# Patient Record
Sex: Female | Born: 1970
Health system: Southern US, Community
[De-identification: ages and names within clinical notes are randomized; demographics above are authoritative.]

## PROBLEM LIST (undated history)

## (undated) DIAGNOSIS — I10 Essential (primary) hypertension: Secondary | ICD-10-CM

---

## 2012-01-12 ENCOUNTER — Emergency Department (INDEPENDENT_AMBULATORY_CARE_PROVIDER_SITE_OTHER): Payer: PRIVATE HEALTH INSURANCE

## 2012-01-12 ENCOUNTER — Encounter (HOSPITAL_BASED_OUTPATIENT_CLINIC_OR_DEPARTMENT_OTHER): Payer: Self-pay | Admitting: *Deleted

## 2012-01-12 ENCOUNTER — Emergency Department (HOSPITAL_BASED_OUTPATIENT_CLINIC_OR_DEPARTMENT_OTHER)
Admission: EM | Admit: 2012-01-12 | Discharge: 2012-01-12 | Disposition: A | Discharge status: Home / Self Care | Payer: PRIVATE HEALTH INSURANCE | Attending: Emergency Medicine | Admitting: Emergency Medicine

## 2012-01-12 DIAGNOSIS — IMO0001 Reserved for inherently not codable concepts without codable children: Secondary | ICD-10-CM | POA: Insufficient documentation

## 2012-01-12 DIAGNOSIS — M79609 Pain in unspecified limb: Secondary | ICD-10-CM

## 2012-01-12 DIAGNOSIS — M19079 Primary osteoarthritis, unspecified ankle and foot: Secondary | ICD-10-CM

## 2012-01-12 DIAGNOSIS — M79673 Pain in unspecified foot: Secondary | ICD-10-CM

## 2012-01-12 NOTE — ED Notes (Signed)
Patient transported to X-ray 

## 2012-01-12 NOTE — ED Provider Notes (Signed)
History     CSN: [id number]  Arrival date & time 01/12/12  1052   First MD Initiated Contact with Patient 01/12/12 1221      Chief Complaint  Patient presents with  . Foot Pain    (Consider location/radiation/quality/duration/timing/severity/associated sxs/prior treatment) Patient is a 41 y.o. female presenting with lower extremity pain. The history is provided by the patient. No language interpreter was used.  Foot Pain This is a new problem. The current episode started today. The problem has been gradually worsening. Associated symptoms include myalgias. Pertinent negatives include no joint swelling. The symptoms are aggravated by nothing. She has tried nothing for the symptoms. The treatment provided moderate relief.  Pt complains of pain in left foot,  Pt reports pain is in the heel of her foot.    History reviewed. No pertinent past medical history.  History reviewed. No pertinent past surgical history.  History reviewed. No pertinent family history.  History  Substance Use Topics  . Smoking status: Never Smoker   . Smokeless tobacco: Not on file  . Alcohol Use: No    OB History    Grav Para Term Preterm Abortions TAB SAB Ect Mult Living                  Review of Systems  Musculoskeletal: Positive for myalgias. Negative for joint swelling.  All other systems reviewed and are negative.    Allergies  Review of patient's allergies indicates no known allergies.  Home Medications  No current outpatient prescriptions on file.  BP 157/100  Pulse 94  Temp(Src) 98.4 F (36.9 C) (Oral)  Resp 20  SpO2 99%  LMP 12/30/2011  Physical Exam  Nursing note and vitals reviewed. Constitutional: She appears well-developed and well-nourished.  HENT:  Head: Normocephalic.  Musculoskeletal: Normal range of motion. She exhibits tenderness.       Tender heel of left foot,  From,  Nv and ns intact   Neurological: She is alert.  Skin: Skin is warm.  Psychiatric: She has a  normal mood and affect.    ED Course  Procedures (including critical care time)  Labs Reviewed - No data to display No results found.   No diagnosis found.    MDM  No results found for this or any previous visit. Dg Foot Complete Left  01/12/2012  *RADIOLOGY REPORT*  Clinical Data: Left foot pain.  No injury.  LEFT FOOT - COMPLETE 3+ VIEW  Comparison: None.  Findings: Mild first MTP joint osteoarthritis.  There is no fracture.  Soft tissues appear within normal limits.  Flexion deformity of the second toe is present at the time of imaging. Phleboliths are noted in the distal leg.  Calcaneus appears normal on the oblique and lateral projections.  There may be some mild forefoot soft tissue swelling.  IMPRESSION: No acute osseous abnormality.  Mild first MTP joint osteoarthritis.  Original Report Authenticated By: Andreas Newport, M.D.        Pt placed in an ace wrap and a post op shoe.  Pt advised ibuprofen    Lonia Skinner [place], [place] 01/12/12 1434

## 2012-01-12 NOTE — Discharge Instructions (Signed)

## 2012-01-12 NOTE — ED Notes (Signed)
Pt states she woke up Sunday morning with left foot pain no known injury reports she has had some intermittent swelling

## 2012-01-14 NOTE — ED Provider Notes (Signed)
History/physical exam/procedure(s) were performed by non-physician practitioner and as supervising physician I was immediately available for consultation/collaboration. I have reviewed all notes and am in agreement with care and plan.   Airam Runions S Ymani Porcher, MD 01/14/12 1437 

## 2018-07-21 ENCOUNTER — Other Ambulatory Visit: Payer: Self-pay | Admitting: Obstetrics & Gynecology

## 2018-07-21 DIAGNOSIS — Z1231 Encounter for screening mammogram for malignant neoplasm of breast: Secondary | ICD-10-CM

## 2018-08-12 ENCOUNTER — Other Ambulatory Visit: Payer: Self-pay | Admitting: *Deleted

## 2018-08-12 ENCOUNTER — Other Ambulatory Visit: Payer: Self-pay

## 2018-08-12 ENCOUNTER — Inpatient Hospital Stay: Payer: Federal, State, Local not specified - PPO

## 2018-08-12 ENCOUNTER — Encounter: Payer: Self-pay | Admitting: Hematology and Oncology

## 2018-08-12 ENCOUNTER — Other Ambulatory Visit: Payer: Self-pay | Admitting: Hematology and Oncology

## 2018-08-12 ENCOUNTER — Encounter (INDEPENDENT_AMBULATORY_CARE_PROVIDER_SITE_OTHER): Payer: Self-pay

## 2018-08-12 ENCOUNTER — Inpatient Hospital Stay
Payer: Federal, State, Local not specified - PPO | Attending: Hematology and Oncology | Admitting: Hematology and Oncology

## 2018-08-12 ENCOUNTER — Ambulatory Visit
Admission: RE | Admit: 2018-08-12 | Discharge: 2018-08-12 | Disposition: A | Discharge status: Home / Self Care | Payer: Federal, State, Local not specified - PPO | Source: Ambulatory Visit | Attending: Hematology and Oncology | Admitting: Hematology and Oncology

## 2018-08-12 VITALS — BP 147/89 | HR 77 | Temp 98.4°F | Resp 12 | Ht 67.0 in | Wt 189.3 lb

## 2018-08-12 DIAGNOSIS — E538 Deficiency of other specified B group vitamins: Secondary | ICD-10-CM

## 2018-08-12 DIAGNOSIS — D509 Iron deficiency anemia, unspecified: Secondary | ICD-10-CM

## 2018-08-12 DIAGNOSIS — R0602 Shortness of breath: Secondary | ICD-10-CM | POA: Diagnosis not present

## 2018-08-12 DIAGNOSIS — N920 Excessive and frequent menstruation with regular cycle: Secondary | ICD-10-CM | POA: Diagnosis not present

## 2018-08-12 LAB — CBC WITH DIFFERENTIAL/PLATELET
ABS IMMATURE GRANULOCYTES: 0.01 10*3/uL (ref 0.00–0.07)
BASOS PCT: 1 %
Basophils Absolute: 0 10*3/uL (ref 0.0–0.1)
EOS PCT: 3 %
Eosinophils Absolute: 0.1 10*3/uL (ref 0.0–0.5)
HEMATOCRIT: 25.6 % — AB (ref 36.0–46.0)
HEMOGLOBIN: 6.9 g/dL — AB (ref 12.0–15.0)
IMMATURE GRANULOCYTES: 0 %
LYMPHS PCT: 41 %
Lymphs Abs: 1.2 10*3/uL (ref 0.7–4.0)
MCH: 17.4 pg — ABNORMAL LOW (ref 26.0–34.0)
MCHC: 27 g/dL — ABNORMAL LOW (ref 30.0–36.0)
MCV: 64.5 fL — AB (ref 80.0–100.0)
MONO ABS: 0.2 10*3/uL (ref 0.1–1.0)
MONOS PCT: 8 %
NEUTROS ABS: 1.3 10*3/uL — AB (ref 1.7–7.7)
Neutrophils Relative %: 47 %
Platelets: 377 10*3/uL (ref 150–400)
RBC: 3.97 MIL/uL (ref 3.87–5.11)
RDW: 20.2 % — AB (ref 11.5–15.5)
WBC: 2.9 10*3/uL — AB (ref 4.0–10.5)
nRBC: 0 % (ref 0.0–0.2)

## 2018-08-12 LAB — RETICULOCYTES
Immature Retic Fract: 15.3 % (ref 2.3–15.9)
RBC.: 3.97 MIL/uL (ref 3.87–5.11)
RETIC COUNT ABSOLUTE: 49.6 10*3/uL (ref 19.0–186.0)
RETIC CT PCT: 1.3 % (ref 0.4–3.1)

## 2018-08-12 LAB — SAMPLE TO BLOOD BANK

## 2018-08-12 LAB — PREPARE RBC (CROSSMATCH)

## 2018-08-12 LAB — FOLATE: FOLATE: 20.7 ng/mL (ref >5.9)

## 2018-08-12 LAB — PREGNANCY, URINE: Preg Test, Ur: NEGATIVE

## 2018-08-12 LAB — ABO/RH: ABO/RH(D): B POS

## 2018-08-12 MED ORDER — SODIUM CHLORIDE 0.9% FLUSH: 3.0000 mL | INTRAVENOUS | Status: DC | PRN

## 2018-08-12 MED ORDER — DIPHENHYDRAMINE HCL 25 MG PO CAPS
25.0000 mg | ORAL_CAPSULE | Freq: Once | ORAL | Status: AC
  Administered 2018-08-12: 25 mg via ORAL

## 2018-08-12 MED ORDER — SODIUM CHLORIDE 0.9% IV SOLUTION: 250.0000 mL | Freq: Once | INTRAVENOUS | Status: DC

## 2018-08-12 MED ORDER — SODIUM CHLORIDE 0.9% FLUSH: 10.0000 mL | INTRAVENOUS | Status: DC | PRN

## 2018-08-12 MED ORDER — ACETAMINOPHEN 325 MG PO TABS
650.0000 mg | ORAL_TABLET | Freq: Once | ORAL | Status: AC
  Administered 2018-08-12: 650 mg via ORAL

## 2018-08-12 NOTE — Progress Notes (Signed)
[facility]-  Cancer Center  Clinic day:  08/12/2018  Chief Complaint: Felicia Jackson is a 47 y.o. female with iron deficiency who is referred in consultation by Dr. Ranae Plumberhelsea Ward for assessment and management.  HPI:  Patient notes that she has known about her anemia for "several years". She was first diagnosed when she had to come into the ED secondary to menorrhagia. She did not require blood transfusion at that time. She was on antepartum oral iron supplementation, but only for a short time.    She was seen by Dr. Elesa MassedWard for her annual GYN exam on 07/21/2018.  Last period was 06/30/2018.  Menses described as moderate lastind 5-6 days.  She was noted to have fatigue.     CBC revealed a hematocrit of 29.6, hemoglobin 7.6, MCV 66.6, platelets 414,000, WBC 5100 with an ANC of 2420.  Differential was unremarkable.  CMP revealed a creatinine 0.7 and normal LFTs.  TSH was 2.232.  Ferritin was 2 with an iron saturation of < 2% and a TIBC of 492.5 (high).  B12 was 66.  Menstrual cycles are irregular. She notes that her cycles are not too heavy. Patient denies any other bleeding; no hematochezia, melena, or gross hematuria.   Symptomatically, patient has intermittent night sweats, however she attributes this to being pre-menopausal. Patient has periods of sore throat secondary to post-nasal drip. She denies chest pain, however she does experience episodes of exertional dyspnea while working. She denies vertigo symptoms.  She denies prior surgeries.  Patient advises that she maintains an adequate appetite. She is eating well. She eats meat on a regular basis. She only eats green leafy vegetables on special occassions. Weight today is 189 lb 4.8 oz (85.9 kg). She denies ice pica.   Patient denies pain in the clinic today.   History reviewed. No pertinent past medical history.  History reviewed. No pertinent surgical history.  Family History  Problem Relation Age of Onset  . Heart  attack Mother   . Hypertension Father     Social History:  reports that she has never smoked. She has never used smokeless tobacco. She reports that she drank alcohol. She reports that she does not use drugs.  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 Patient denies known exposures to radiation on toxins. The patient is accompanied by her friend, Felicia Jackson,  today.  Allergies: No Known Allergies  Current Medications: Current Outpatient Medications  Medication Sig Dispense Refill  . ibuprofen (ADVIL,MOTRIN) 100 MG/5ML suspension Take 200 mg by mouth every 6 (six) hours as needed for moderate pain.     No current facility-administered medications for this visit.     Review of Systems:  GENERAL:  Feels good.  Active.  No fevers or weight loss. Sweats at night felt to be peri-menopausal (non-drenching; "little moist"). PERFORMANCE STATUS (ECOG):  0 HEENT:  Sore throat comes and goes.  No visual changes, runny nose, mouth sores or tenderness. Lungs: Shortness of breath sometimes at work.  Cough.  No hemoptysis. Cardiac:  No chest pain, palpitations, orthopnea, or PND. GI:  No nausea, vomiting, diarrhea, constipation, melena or hematochezia. GU:  No urgency, frequency, dysuria, or hematuria. Musculoskeletal:  No back pain.  No joint pain.  No muscle tenderness. Extremities:  No pain or swelling. Skin:  No rashes or skin changes. Neuro:  No dizziness/lightheadedness.  No headache, numbness or weakness, balance or coordination issues. Endocrine:  No diabetes, thyroid issues, hot flashes.  Peri-menopasual sweats. Psych:  No mood changes, depression or anxiety. Pain:  No focal pain. Review of systems:  All other systems reviewed and found to be negative.  Physical Exam: Blood pressure (!) 147/89, pulse 77, temperature 98.4 F (36.9 C), temperature source Tympanic, resp. rate 12, height 5\' 7"  (1.702 m), weight 189 lb 4.8 oz (85.9 kg). GENERAL:  Well developed, well nourished, woman  sitting comfortably in the exam room in no acute distress. MENTAL STATUS:  Alert and oriented to person, place and time. HEAD:  Shoulder length black hair.  Normocephalic, atraumatic, face symmetric, no Cushingoid features. EYES:  Glasses.  Brown eyes.  Pupils equal round and reactive to light and accomodation.  No conjunctivitis or scleral icterus. ENT:  Oropharynx clear without lesion.  Tongue normal.  Poor dentition.  Mucous membranes moist.  RESPIRATORY:  Clear to auscultation without rales, wheezes or rhonchi. CARDIOVASCULAR:  Regular rate and rhythm without murmur, rub or gallop. ABDOMEN:  Soft, non-tender, with active bowel sounds, and no hepatosplenomegaly.  No masses. SKIN:  No rashes, ulcers or lesions. EXTREMITIES: No edema, no skin discoloration or tenderness.  No palpable cords. LYMPH NODES: No palpable cervical, supraclavicular, axillary or inguinal adenopathy  NEUROLOGICAL: Unremarkable. PSYCH:  Appropriate.   No visits with results within 3 Day(s) from this visit.  Latest known visit with results is:  No results found for any previous visit.    Assessment:  Felicia Jackson is a 47 y.o. female with iron deficiency and B12 deficiency.  He has heavy menses.  Diet is good.  Labs on 07/21/2018 revealed a hematocrit of 29.6, hemoglobin 7.6, MCV 66.6, platelets 414,000, WBC 5100 with an ANC of 2420.  Normal studies included: TSH, creatinine, and LFTs.  Ferritin was 2 with an iron saturation of < 2% and a TIBC of 492.5 (high).  B12 was 66.  Symptomatically, she notes shortness of breath at work.  She denies any hematuria, melena, or hematochezia.  Exam is unremarkable.  Plan: 1.  Labs today:  CBC with diff, retic, folate, hold tube, urine pregnancy test. 2.  Iron deficiency anemia:  Discuss trial of oral iron with OJ or vitamin C.  Discuss IV iron if unable to tolerate oral iron or hemoglobin and iron stores do not improve.  Ferritin goal is 100.  Preauth Venofer. 3.  B12  deficiency:  Discuss B12 deficiency.  Discuss oral B12 1000 mcg a day.  If unable to improve B12 with oral supplements, discuss B12 injections (weekly x 6 then monthly). 4.  RTC in 1 month for MD assessment, labs (CBC with diff, ferritin, B12 - day before), and +/- Venofer.    Quentin Mulling, NP  08/12/2018, 9:22 AM   I saw and evaluated the patient, participating in the key portions of the service and reviewing pertinent diagnostic studies and records.  I reviewed the nurse practitioner's note and agree with the findings and the plan.  The assessment and plan were discussed with the patient.  Several questions were asked by the patient and answered.   Nelva Nay, MD 08/12/2018,9:22 AM

## 2018-08-12 NOTE — Progress Notes (Signed)
Patient here for initial visit.she reports sudden occasional shortness of breath.

## 2018-08-13 LAB — TYPE AND SCREEN
ABO/RH(D): B POS
Antibody Screen: NEGATIVE
Unit division: 0

## 2018-08-13 LAB — BPAM RBC
Blood Product Expiration Date: 201912282359
ISSUE DATE / TIME: 201912061203
Unit Type and Rh: 7300

## 2018-09-05 ENCOUNTER — Telehealth: Payer: Self-pay | Admitting: *Deleted

## 2018-09-05 NOTE — Telephone Encounter (Signed)
Called patient to inquire how she is doing.  She states she is doing well.  She is taking her oral iron and B-12.  Patient states she is doing well.

## 2018-09-05 NOTE — Telephone Encounter (Signed)
-----   Message from Rosey BathMelissa C Corcoran, MD sent at 09/03/2018  5:30 AM EST -----  Please call patient and see how she is doing.  Is she taking her oral iron and tolerating it well?  Is she taking her B12?  M

## 2018-09-12 ENCOUNTER — Inpatient Hospital Stay: Payer: Federal, State, Local not specified - PPO | Attending: Hematology and Oncology

## 2018-09-12 DIAGNOSIS — D509 Iron deficiency anemia, unspecified: Secondary | ICD-10-CM | POA: Diagnosis not present

## 2018-09-12 DIAGNOSIS — E538 Deficiency of other specified B group vitamins: Secondary | ICD-10-CM | POA: Insufficient documentation

## 2018-09-12 LAB — CBC WITH DIFFERENTIAL/PLATELET
Abs Immature Granulocytes: 0.01 10*3/uL (ref 0.00–0.07)
Basophils Absolute: 0 10*3/uL (ref 0.0–0.1)
Basophils Relative: 1 %
Eosinophils Absolute: 0.1 10*3/uL (ref 0.0–0.5)
Eosinophils Relative: 1 %
HCT: 35.7 % — ABNORMAL LOW (ref 36.0–46.0)
Hemoglobin: 10 g/dL — ABNORMAL LOW (ref 12.0–15.0)
Immature Granulocytes: 0 %
Lymphocytes Relative: 36 %
Lymphs Abs: 1.8 10*3/uL (ref 0.7–4.0)
MCH: 20.6 pg — ABNORMAL LOW (ref 26.0–34.0)
MCHC: 28 g/dL — ABNORMAL LOW (ref 30.0–36.0)
MCV: 73.5 fL — ABNORMAL LOW (ref 80.0–100.0)
Monocytes Absolute: 0.4 10*3/uL (ref 0.1–1.0)
Monocytes Relative: 8 %
Neutro Abs: 2.8 10*3/uL (ref 1.7–7.7)
Neutrophils Relative %: 54 %
Platelets: 312 10*3/uL (ref 150–400)
RBC: 4.86 MIL/uL (ref 3.87–5.11)
RDW: 27.9 % — ABNORMAL HIGH (ref 11.5–15.5)
WBC: 5.1 10*3/uL (ref 4.0–10.5)
nRBC: 0 % (ref 0.0–0.2)

## 2018-09-12 LAB — VITAMIN B12: Vitamin B-12: 172 pg/mL — ABNORMAL LOW (ref 180–914)

## 2018-09-12 LAB — FERRITIN: Ferritin: 9 ng/mL — ABNORMAL LOW (ref 11–307)

## 2018-09-13 ENCOUNTER — Inpatient Hospital Stay: Payer: Federal, State, Local not specified - PPO

## 2018-09-13 ENCOUNTER — Inpatient Hospital Stay (HOSPITAL_BASED_OUTPATIENT_CLINIC_OR_DEPARTMENT_OTHER): Payer: Federal, State, Local not specified - PPO | Admitting: Hematology and Oncology

## 2018-09-13 ENCOUNTER — Other Ambulatory Visit: Payer: Self-pay | Admitting: Hematology and Oncology

## 2018-09-13 ENCOUNTER — Encounter: Payer: Self-pay | Admitting: Hematology and Oncology

## 2018-09-13 VITALS — BP 148/94 | HR 75 | Temp 96.9°F | Wt 191.7 lb

## 2018-09-13 DIAGNOSIS — E538 Deficiency of other specified B group vitamins: Secondary | ICD-10-CM

## 2018-09-13 DIAGNOSIS — D509 Iron deficiency anemia, unspecified: Secondary | ICD-10-CM | POA: Diagnosis not present

## 2018-09-13 MED ORDER — CYANOCOBALAMIN 1000 MCG/ML IJ SOLN
1000.0000 ug | Freq: Once | INTRAMUSCULAR | Status: AC
  Administered 2018-09-13: 1000 ug via INTRAMUSCULAR
  Filled 2018-09-13: qty 1

## 2018-09-13 MED ORDER — CYANOCOBALAMIN 1000 MCG/ML IJ SOLN: 1000.0000 ug | Freq: Once | INTRAMUSCULAR | Status: AC

## 2018-09-13 NOTE — Progress Notes (Signed)
[facility]-  Cancer Center  Clinic day:  09/13/2018  Chief Complaint: Felicia Jackson is a 48 y.o. female with iron deficiency and B12 deficiency who is seen for 1 month assessment.  HPI:  The patient was last seen in the hematology clinic on 08/12/2018 for initial consultation.  She had iron deficiency and B12 deficiency.  She noted shortness of breath at work.  She denied any hematuria, melena, or hematochezia.  Exam was unremarkable.  Work-up revealed a hematocrit of 25.6, hemoglobin 6.9, MCV 64.5, platelets 377,000, WBC 2900 with an ANC of 1300.  Retic was 1.3%.  Folate was 20.7.  She received 1 unit of PRBCs on 08/12/2018.  We discussed a trial of oral iron and B12.    Labs on 09/12/2017 revealed a hematocrit of 35.7, hemoglobin 10.0, MCV 73.5, platelets 312,000, WBC 5100 With an ANC of 2800.  Ferritin was 9.  B12 was 172.  During the interim, she has noted no difference after the blood transfusion.  However, she is feeling good.  She denies any shortness of breath at work.  She denies any chest pain or palpitations.  She denies any pica.   She denies regular menstrual cycles.  Menses is every few months.  She is eating better.  She is eating 3 meals a day and better foods.  She is taking oral iron 1 a day without constipation.  She is taking B12 1 tablet a day.     History reviewed. No pertinent past medical history.  History reviewed. No pertinent surgical history.  Family History  Problem Relation Age of Onset  . Heart attack Mother   . Hypertension Father     Social History:  reports that she has never smoked. She has never used smokeless tobacco. She reports previous alcohol use. She reports that she does not use drugs.  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 Patient denies known exposures to radiation on toxins. The patient is alone  today.  Allergies: No Known Allergies  Current Medications: Current Outpatient Medications  Medication Sig  Dispense Refill  . Cyanocobalamin (B-12) 100 MCG TABS Take 1 tablet by mouth daily.    . ferrous sulfate 325 (65 FE) MG tablet Take 325 mg by mouth 2 (two) times daily with a meal.     . ibuprofen (ADVIL,MOTRIN) 100 MG/5ML suspension Take 200 mg by mouth every 6 (six) hours as needed for moderate pain.     No current facility-administered medications for this visit.     Review of Systems:  GENERAL:  Feels good.  No fevers, sweats or weight loss.  Weight up 2 pounds. PERFORMANCE STATUS (ECOG):  0 HEENT:  No visual changes, runny nose, sore throat, mouth sores or tenderness. Lungs: No shortness of breath or cough.  No hemoptysis. Cardiac:  No chest pain, palpitations, orthopnea, or PND. GI:  Eating better.  No nausea, vomiting, diarrhea, constipation, melena or hematochezia.  No pica. GU:  No urgency, frequency, dysuria, or hematuria. Musculoskeletal:  No back pain.  No joint pain.  No muscle tenderness. Extremities:  No pain or swelling. Skin:  No rashes or skin changes. Neuro:  No headache, numbness or weakness, balance or coordination issues. Endocrine:  No diabetes, thyroid issues, hot flashes or night sweats. Psych:  No mood changes, depression or anxiety. Pain:  No focal pain. Review of systems:  All other systems reviewed and found to be negative.   Physical Exam: Blood pressure (!) 148/94, pulse 75,  temperature (!) 96.9 F (36.1 C), temperature source Tympanic, weight 191 lb 11.2 oz (87 kg). GENERAL:  Well developed, well nourished, woman sitting comfortably in the exam room in no acute distress. MENTAL STATUS:  Alert and oriented to person, place and time. HEAD:  Shoulder length dark hair.  Normocephalic, atraumatic, face symmetric, no Cushingoid features. EYES:  Glasses.  Brown eyes.  Pupils equal round and reactive to light and accomodation.  No conjunctivitis or scleral icterus. ENT:  Oropharynx clear without lesion.  Tongue normal.  Poor dentition.  Mucous membranes moist.   RESPIRATORY:  Clear to auscultation without rales, wheezes or rhonchi. CARDIOVASCULAR:  Regular rate and rhythm without murmur, rub or gallop. ABDOMEN:  Soft, non-tender, with active bowel sounds, and no hepatosplenomegaly.  No masses. SKIN:  No rashes, ulcers or lesions. EXTREMITIES: No edema, no skin discoloration or tenderness.  No palpable cords. LYMPH NODES: No palpable cervical, supraclavicular, axillary or inguinal adenopathy  NEUROLOGICAL: Unremarkable. PSYCH:  Appropriate.    Appointment on 09/12/2018  Component Date Value Ref Range Status  . Ferritin 09/12/2018 9* 11 - 307 ng/mL Final   Performed at [facility], [address] [place], [place] [address]  . WBC 09/12/2018 5.1  4.0 - 10.5 K/uL Final  . RBC 09/12/2018 4.86  3.87 - 5.11 MIL/uL Final  . Hemoglobin 09/12/2018 10.0* 12.0 - 15.0 g/dL Final   Comment: Reticulocyte Hemoglobin testing may be clinically indicated, consider ordering this additional test [id number]   . HCT 09/12/2018 35.7* 36.0 - 46.0 % Final  . MCV 09/12/2018 73.5* 80.0 - 100.0 fL Final  . MCH 09/12/2018 20.6* 26.0 - 34.0 pg Final  . MCHC 09/12/2018 28.0* 30.0 - 36.0 g/dL Final  . RDW 56/21/308601/02/2019 27.9* 11.5 - 15.5 % Final  . Platelets 09/12/2018 312  150 - 400 K/uL Final  . nRBC 09/12/2018 0.0  0.0 - 0.2 % Final  . Neutrophils Relative % 09/12/2018 54  % Final  . Neutro Abs 09/12/2018 2.8  1.7 - 7.7 K/uL Final  . Lymphocytes Relative 09/12/2018 36  % Final  . Lymphs Abs 09/12/2018 1.8  0.7 - 4.0 K/uL Final  . Monocytes Relative 09/12/2018 8  % Final  . Monocytes Absolute 09/12/2018 0.4  0.1 - 1.0 K/uL Final  . Eosinophils Relative 09/12/2018 1  % Final  . Eosinophils Absolute 09/12/2018 0.1  0.0 - 0.5 K/uL Final  . Basophils Relative 09/12/2018 1  % Final  . Basophils Absolute 09/12/2018 0.0  0.0 - 0.1 K/uL Final  . Immature Granulocytes 09/12/2018 0  % Final  . Abs Immature Granulocytes 09/12/2018 0.01  0.00 - 0.07 K/uL Final    Performed at [facility], [address] [place], [place] [address]  . Vitamin B-12 09/12/2018 172* 180 - 914 pg/mL Final   Comment: (NOTE) This assay is not validated for testing neonatal or myeloproliferative syndrome specimens for Vitamin B12 levels. Performed at [facility], [address] [place], [place] [address]     Assessment:  Jasmine AweCarolyn Welge is a 48 y.o. female with iron deficiency and B12 deficiency.  He has heavy menses.  Diet is good.  Labs on 07/21/2018 revealed a hematocrit of 29.6, hemoglobin 7.6, MCV 66.6, platelets 414,000, WBC 5100 with an ANC of 2420.  Normal studies included: TSH, creatinine, and LFTs.  Ferritin was 2 with an iron saturation of < 2% and a TIBC of 492.5 (high).  B12 was 66.  Work-up on 08/12/2018 revealed a hematocrit of  25.6, hemoglobin 6.9, MCV 64.5, platelets 377,000, WBC 2900 with an ANC of 1300.  Retic was 1.3%.  Folate was 20.7.  She received 1 unit of PRBCs on 08/12/2018.  She is on oral iron.  Ferritin has been followed: 2 on 07/21/2018 and 9 on 09/12/2018.  She has B12 deficiency.  B12 was 66 on 07/21/2018 and 172 on 09/12/2018.  She is on oral B12.  Symptomatically, she is feeling better.  She denies any bleeding (melena, hematochezia, or hematuria).  Menstrual cycles are irregular.  Exam is unremarkable.  Hemoglobin is 10.  Plan: 1.   Review labs from yesterday. 2.   Iron deficiency anemia  Hematocrit is 35.7.  Hemoglobin is 10.  MCV is 73.5.  Patient tolerating oral iron well.  Ferritin is 9.  Ferritin goal is 100.  Continue oral iron as hemoglobin is improving. 3.   B12 deficiency  B12 is 172 (low).  Patient has not improved significantly after 1 month of oral B12.  Discuss B12 injections weekly x 6 then monthly.  Discuss r/o pernicious anemia (intrinsic factor antibodies and anti-parietal antibodies). 4.   B12 today and weekly x 5 5.   RTC in 5 weeks (for last B12 shot) for MD assessment, labs (CBC with diff,  ferritin, intrinsic factor, antiparietal antibodies), and B12.   Rosey BathMelissa C Corcoran, MD  09/13/2018, 5:35 PM

## 2018-09-20 ENCOUNTER — Inpatient Hospital Stay: Payer: Federal, State, Local not specified - PPO

## 2018-09-20 DIAGNOSIS — D509 Iron deficiency anemia, unspecified: Secondary | ICD-10-CM

## 2018-09-20 MED ORDER — CYANOCOBALAMIN 1000 MCG/ML IJ SOLN
1000.0000 ug | Freq: Once | INTRAMUSCULAR | Status: AC
  Administered 2018-09-20: 1000 ug via INTRAMUSCULAR

## 2018-09-27 ENCOUNTER — Inpatient Hospital Stay: Payer: Federal, State, Local not specified - PPO

## 2018-09-27 DIAGNOSIS — D509 Iron deficiency anemia, unspecified: Secondary | ICD-10-CM | POA: Diagnosis not present

## 2018-09-27 MED ORDER — CYANOCOBALAMIN 1000 MCG/ML IJ SOLN
1000.0000 ug | Freq: Once | INTRAMUSCULAR | Status: AC
  Administered 2018-09-27: 1000 ug via INTRAMUSCULAR

## 2018-10-04 ENCOUNTER — Inpatient Hospital Stay: Payer: Federal, State, Local not specified - PPO

## 2018-10-04 DIAGNOSIS — D509 Iron deficiency anemia, unspecified: Secondary | ICD-10-CM

## 2018-10-04 MED ORDER — CYANOCOBALAMIN 1000 MCG/ML IJ SOLN
1000.0000 ug | Freq: Once | INTRAMUSCULAR | Status: AC
  Administered 2018-10-04: 1000 ug via INTRAMUSCULAR

## 2018-10-11 ENCOUNTER — Inpatient Hospital Stay: Payer: Federal, State, Local not specified - PPO | Attending: Hematology and Oncology

## 2018-10-11 DIAGNOSIS — D509 Iron deficiency anemia, unspecified: Secondary | ICD-10-CM

## 2018-10-11 DIAGNOSIS — E538 Deficiency of other specified B group vitamins: Secondary | ICD-10-CM | POA: Diagnosis present

## 2018-10-11 MED ORDER — CYANOCOBALAMIN 1000 MCG/ML IJ SOLN
1000.0000 ug | Freq: Once | INTRAMUSCULAR | Status: AC
  Administered 2018-10-11: 1000 ug via INTRAMUSCULAR

## 2018-10-18 ENCOUNTER — Ambulatory Visit: Payer: Federal, State, Local not specified - PPO

## 2018-10-21 ENCOUNTER — Other Ambulatory Visit: Payer: Self-pay

## 2018-10-21 ENCOUNTER — Inpatient Hospital Stay (HOSPITAL_BASED_OUTPATIENT_CLINIC_OR_DEPARTMENT_OTHER): Payer: Federal, State, Local not specified - PPO | Admitting: Hematology and Oncology

## 2018-10-21 ENCOUNTER — Inpatient Hospital Stay: Payer: Federal, State, Local not specified - PPO

## 2018-10-21 ENCOUNTER — Encounter: Payer: Self-pay | Admitting: Hematology and Oncology

## 2018-10-21 VITALS — BP 131/87 | HR 73 | Temp 98.2°F | Resp 16 | Wt 195.3 lb

## 2018-10-21 DIAGNOSIS — D509 Iron deficiency anemia, unspecified: Secondary | ICD-10-CM

## 2018-10-21 DIAGNOSIS — E538 Deficiency of other specified B group vitamins: Secondary | ICD-10-CM | POA: Diagnosis not present

## 2018-10-21 LAB — CBC WITH DIFFERENTIAL/PLATELET
Abs Immature Granulocytes: 0.01 10*3/uL (ref 0.00–0.07)
Basophils Absolute: 0 10*3/uL (ref 0.0–0.1)
Basophils Relative: 1 %
Eosinophils Absolute: 0.1 10*3/uL (ref 0.0–0.5)
Eosinophils Relative: 2 %
HCT: 41.5 % (ref 36.0–46.0)
Hemoglobin: 12 g/dL (ref 12.0–15.0)
Immature Granulocytes: 0 %
Lymphocytes Relative: 33 %
Lymphs Abs: 1.4 10*3/uL (ref 0.7–4.0)
MCH: 23.7 pg — ABNORMAL LOW (ref 26.0–34.0)
MCHC: 28.9 g/dL — ABNORMAL LOW (ref 30.0–36.0)
MCV: 82 fL (ref 80.0–100.0)
Monocytes Absolute: 0.4 10*3/uL (ref 0.1–1.0)
Monocytes Relative: 9 %
Neutro Abs: 2.2 10*3/uL (ref 1.7–7.7)
Neutrophils Relative %: 55 %
Platelets: 305 10*3/uL (ref 150–400)
RBC: 5.06 MIL/uL (ref 3.87–5.11)
RDW: 22.7 % — ABNORMAL HIGH (ref 11.5–15.5)
WBC: 4 10*3/uL (ref 4.0–10.5)
nRBC: 0 % (ref 0.0–0.2)

## 2018-10-21 LAB — FERRITIN: Ferritin: 12 ng/mL (ref 11–307)

## 2018-10-21 MED ORDER — CYANOCOBALAMIN 1000 MCG/ML IJ SOLN
1000.0000 ug | Freq: Once | INTRAMUSCULAR | Status: AC
  Administered 2018-10-21: 1000 ug via INTRAMUSCULAR
  Filled 2018-10-21: qty 1

## 2018-10-21 NOTE — Progress Notes (Signed)
Pt here for follow up. Denies any concerns at this time.  

## 2018-10-21 NOTE — Progress Notes (Signed)
[facility]-  Cancer Center  Clinic day:  10/21/2018  Chief Complaint: Felicia Jackson is a 48 y.o. female with iron deficiency and B12 deficiency who is seen for 6 week assessment.  HPI:  The patient was last seen in the hematology clinic on 09/13/2018.  At that time, she was feeling better.  She denied any shortness of breath at work.  She was eating better.  She denied any pica.  She was tolerating oral iron well.  Labs revealed a hematocrit of 35.7, hemoglobin 10.0, and MCV 73.5.  Ferritin was 9.  B12 was 172.  She increased her oral iron from 1 day to BID.  She began weekly B12 x 6 (09/13/2018, 09/20/2018, 09/27/2018, 10/04/2018, 10/11/2018)  Symptomatically, patient is doing well. She notes that her energy continues to improve. She denies any associated shortness of breath. Patient is on oral iron supplements BID with a source of vitamin C. Patient denies any associated gastrointestinal side effects. Patient denies bleeding; no hematochezia, melena, or gross hematuria. Patient denies ice pica and restless legs.   Patient denies that she has experienced any B symptoms. She denies any interval infections.   Patient advises that she maintains an adequate appetite. She is eating well. Weight today is 195 lb 4.8 oz (88.6 kg), which compared to her last visit to the clinic, represents a 4 pound increase.  Patient denies pain in the clinic today.   History reviewed. No pertinent past medical history.  History reviewed. No pertinent surgical history.  Family History  Problem Relation Age of Onset  . Heart attack Mother   . Hypertension Father     Social History:  reports that she has never smoked. She has never used smokeless tobacco. She reports previous alcohol use. She reports that she does not use drugs.  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  Patient denies known exposures to radiation on toxins.  Allergies: No Known Allergies  Current  Medications: Current Outpatient Medications  Medication Sig Dispense Refill  . Cyanocobalamin (B-12) 100 MCG TABS Take 1 tablet by mouth daily.    . ferrous sulfate 325 (65 FE) MG tablet Take 325 mg by mouth 2 (two) times daily with a meal.     . ibuprofen (ADVIL,MOTRIN) 100 MG/5ML suspension Take 200 mg by mouth every 6 (six) hours as needed for moderate pain.     No current facility-administered medications for this visit.     Review of Systems:  GENERAL:  Feels good.  Energy level is better.  No fevers, sweats.  Weight up 4 pounds. PERFORMANCE STATUS (ECOG):  0 HEENT:  No visual changes, runny nose, sore throat, mouth sores or tenderness. Lungs: No shortness of breath or cough.  No hemoptysis. Cardiac:  No chest pain, palpitations, orthopnea, or PND. GI:  No nausea, vomiting, diarrhea, constipation, melena or hematochezia.  No ice pica. GU:  No urgency, frequency, dysuria, or hematuria. Musculoskeletal:  No back pain.  No joint pain.  No muscle tenderness. Extremities:  No pain or swelling. Skin:  No rashes or skin changes. Neuro:  No headache, numbness or weakness, balance or coordination issues.  No restless legs. Endocrine:  No diabetes, thyroid issues, hot flashes.  Peri-menopausal sweats. Psych:  No mood changes, depression or anxiety. Pain:  No focal pain. Review of systems:  All other systems reviewed and found to be negative.   Physical Exam: Blood pressure 131/87, pulse 73, temperature 98.2 F (36.8 C), temperature source Oral, resp.  rate 16, weight 195 lb 4.8 oz (88.6 kg), SpO2 99 %. GENERAL:  Well developed, well nourished, woman sitting comfortably in the exam room in no acute distress. MENTAL STATUS:  Alert and oriented to person, place and time. HEAD:  Shoulder length dark hair.  Normocephalic, atraumatic, face symmetric, no Cushingoid features. EYES:  Brown eyes.  Pupils equal round and reactive to light and accomodation.  No conjunctivitis or scleral icterus. ENT:   Oropharynx clear without lesion.  Tongue normal. Mucous membranes moist.  RESPIRATORY:  Clear to auscultation without rales, wheezes or rhonchi. CARDIOVASCULAR:  Regular rate and rhythm without murmur, rub or gallop. ABDOMEN:  Soft, non-tender, with active bowel sounds, and no hepatosplenomegaly.  No masses. SKIN:  No rashes, ulcers or lesions. EXTREMITIES: No edema, no skin discoloration or tenderness.  No palpable cords. LYMPH NODES: No palpable cervical, supraclavicular, axillary or inguinal adenopathy  NEUROLOGICAL: Unremarkable. PSYCH:  Appropriate.    Orders Only on 10/21/2018  Component Date Value Ref Range Status  . WBC 10/21/2018 4.0  4.0 - 10.5 K/uL Final  . RBC 10/21/2018 5.06  3.87 - 5.11 MIL/uL Final  . Hemoglobin 10/21/2018 12.0  12.0 - 15.0 g/dL Final  . HCT 48/54/6270 41.5  36.0 - 46.0 % Final  . MCV 10/21/2018 82.0  80.0 - 100.0 fL Final  . MCH 10/21/2018 23.7* 26.0 - 34.0 pg Final  . MCHC 10/21/2018 28.9* 30.0 - 36.0 g/dL Final  . RDW 35/00/9381 22.7* 11.5 - 15.5 % Final  . Platelets 10/21/2018 305  150 - 400 K/uL Final  . nRBC 10/21/2018 0.0  0.0 - 0.2 % Final   Performed at [facility], [address], [place], [place] [address]  . Neutrophils Relative % 10/21/2018 PENDING  % Incomplete  . Neutro Abs 10/21/2018 PENDING  1.7 - 7.7 K/uL Incomplete  . Band Neutrophils 10/21/2018 PENDING  % Incomplete  . Lymphocytes Relative 10/21/2018 PENDING  % Incomplete  . Lymphs Abs 10/21/2018 PENDING  0.7 - 4.0 K/uL Incomplete  . Monocytes Relative 10/21/2018 PENDING  % Incomplete  . Monocytes Absolute 10/21/2018 PENDING  0.1 - 1.0 K/uL Incomplete  . Eosinophils Relative 10/21/2018 PENDING  % Incomplete  . Eosinophils Absolute 10/21/2018 PENDING  0.0 - 0.5 K/uL Incomplete  . Basophils Relative 10/21/2018 PENDING  % Incomplete  . Basophils Absolute 10/21/2018 PENDING  0.0 - 0.1 K/uL Incomplete  . WBC Morphology 10/21/2018 PENDING   Incomplete  . RBC Morphology  10/21/2018 PENDING   Incomplete  . Smear Review 10/21/2018 PENDING   Incomplete  . Other 10/21/2018 PENDING  % Incomplete  . nRBC 10/21/2018 PENDING  0 /100 WBC Incomplete  . Metamyelocytes Relative 10/21/2018 PENDING  % Incomplete  . Myelocytes 10/21/2018 PENDING  % Incomplete  . Promyelocytes Relative 10/21/2018 PENDING  % Incomplete  . Blasts 10/21/2018 PENDING  % Incomplete    Assessment:  Ommie Casali is a 48 y.o. female with iron deficiency and B12 deficiency.  He has heavy menses.  Diet is good.  Labs on 07/21/2018 revealed a hematocrit of 29.6, hemoglobin 7.6, MCV 66.6, platelets 414,000, WBC 5100 with an ANC of 2420.  Normal studies included: TSH, creatinine, and LFTs.  Ferritin was 2 with an iron saturation of < 2% and a TIBC of 492.5 (high).  B12 was 66.  Work-up on 08/12/2018 revealed a hematocrit of 25.6, hemoglobin 6.9, MCV 64.5, platelets 377,000, WBC 2900 with an ANC of 1300.  Retic was 1.3%.  Folate was 20.7.  She received 1  unit of PRBCs on 08/12/2018.  She is on oral iron.  Ferritin has been followed: 2 on 07/21/2018 and 9 on 09/12/2018.  She has B12 deficiency.  B12 was 66 on 07/21/2018 and 172 on 09/12/2018.  She was on oral B12.  She began weekly B12 on 09/13/2018 (last 10/11/2018).  Symptomatically, she is doing well.  Energy level has improved.  Exam is unremarkable.  Plan: 1.   Labs today:  CBC with diff, ferritin, anti-parietal antibodies, anti-intrinsic factor antibodies. 2.   Iron deficiency anemia  Hemoglobin 12.0.  MCV 82.    Ferritin 12.  Ferritin goal is 100.  Continue oral iron BID. 3.   B12 deficiency  B12 level was 172 on 09/12/2018.  She began weekly B12 injections on 09/13/2018 (6 of 6 today).  Labs to r/o pernicious anemia today.  Begin B12 monthly beginning 11/18/2018. 4.   RTC in 3 months for MD assessment, labs (CBC with diff, ferritin - day before), B12 injection, and +/- Venofer.    Quentin Mulling, NP  10/21/2018, 2:31 PM   I saw and  evaluated the patient, participating in the key portions of the service and reviewing pertinent diagnostic studies and records.  I reviewed the nurse practitioner's note and agree with the findings and the plan.  The assessment and plan were discussed with the patient.  Several questions were asked by the patient and answered.   Nelva Nay, MD 10/21/2018,2:31 PM

## 2018-10-21 NOTE — Progress Notes (Signed)
B12 injection order is for Q4weeks but at last MD visit on 09/13/2018 check out note states for patient to have B12 injections that day and weekly x 5.  Today will be the 5th weekly B12 injection.

## 2018-10-23 LAB — ANTI-PARIETAL ANTIBODY: Parietal Cell Antibody-IgG: 34.8 Units — ABNORMAL HIGH (ref 0.0–20.0)

## 2018-10-24 LAB — INTRINSIC FACTOR ANTIBODIES: Intrinsic Factor: 1.1 AU/mL (ref 0.0–1.1)

## 2018-11-18 ENCOUNTER — Ambulatory Visit: Payer: Federal, State, Local not specified - PPO

## 2018-11-22 ENCOUNTER — Ambulatory Visit: Payer: Federal, State, Local not specified - PPO

## 2018-12-20 ENCOUNTER — Ambulatory Visit: Payer: Federal, State, Local not specified - PPO

## 2018-12-23 ENCOUNTER — Inpatient Hospital Stay: Payer: Federal, State, Local not specified - PPO | Attending: Hematology and Oncology

## 2019-01-18 ENCOUNTER — Other Ambulatory Visit: Payer: Federal, State, Local not specified - PPO

## 2019-01-19 ENCOUNTER — Ambulatory Visit: Payer: Federal, State, Local not specified - PPO

## 2019-01-19 ENCOUNTER — Ambulatory Visit: Payer: Federal, State, Local not specified - PPO | Admitting: Hematology and Oncology

## 2019-01-19 ENCOUNTER — Other Ambulatory Visit: Payer: Federal, State, Local not specified - PPO

## 2019-01-20 ENCOUNTER — Ambulatory Visit: Payer: Federal, State, Local not specified - PPO

## 2019-01-20 ENCOUNTER — Ambulatory Visit: Payer: Federal, State, Local not specified - PPO | Admitting: Hematology and Oncology

## 2019-02-17 ENCOUNTER — Telehealth: Payer: Self-pay

## 2019-02-17 NOTE — Telephone Encounter (Signed)
-----   Message from Lequita Asal, MD sent at 02/17/2019  2:28 PM EDT ----- Regarding: Please call patient  Patient has not had monthly B12 or f/u of her iron deficiency anemia.  M

## 2019-02-17 NOTE — Telephone Encounter (Signed)
Left a message to inform the patient that we haven't seen her recently in the office. And she need to call the office as soon as possible so we can get her reschedule for her B-12 injection and f/u on her Anemia. I have informed the patient to call [phone] so we can get her to Dr Mike Gip soon.

## 2019-10-25 DIAGNOSIS — I160 Hypertensive urgency: Secondary | ICD-10-CM | POA: Diagnosis not present

## 2019-10-25 DIAGNOSIS — R1310 Dysphagia, unspecified: Secondary | ICD-10-CM | POA: Diagnosis not present

## 2019-10-25 DIAGNOSIS — R490 Dysphonia: Secondary | ICD-10-CM | POA: Diagnosis not present

## 2019-12-27 ENCOUNTER — Emergency Department
Admission: EM | Admit: 2019-12-27 | Discharge: 2019-12-27 | Disposition: A | Discharge status: Home / Self Care | Payer: Federal, State, Local not specified - PPO | Attending: Emergency Medicine | Admitting: Emergency Medicine

## 2019-12-27 ENCOUNTER — Emergency Department: Payer: Federal, State, Local not specified - PPO

## 2019-12-27 ENCOUNTER — Other Ambulatory Visit: Payer: Self-pay

## 2019-12-27 DIAGNOSIS — M7552 Bursitis of left shoulder: Secondary | ICD-10-CM | POA: Diagnosis not present

## 2019-12-27 DIAGNOSIS — R030 Elevated blood-pressure reading, without diagnosis of hypertension: Secondary | ICD-10-CM | POA: Diagnosis not present

## 2019-12-27 DIAGNOSIS — I10 Essential (primary) hypertension: Secondary | ICD-10-CM | POA: Diagnosis not present

## 2019-12-27 DIAGNOSIS — M25512 Pain in left shoulder: Secondary | ICD-10-CM | POA: Diagnosis not present

## 2019-12-27 DIAGNOSIS — Z79899 Other long term (current) drug therapy: Secondary | ICD-10-CM | POA: Insufficient documentation

## 2019-12-27 HISTORY — DX: Essential (primary) hypertension: I10

## 2019-12-27 MED ORDER — HYDROCHLOROTHIAZIDE 25 MG PO TABS: 25.0000 mg | ORAL_TABLET | Freq: Every day | ORAL | 1 refills | Status: DC

## 2019-12-27 MED ORDER — PREDNISONE 10 MG PO TABS: ORAL_TABLET | ORAL | 0 refills | Status: DC

## 2019-12-27 NOTE — ED Notes (Signed)
Says left hsoulder pain for days.  Says it hurts upper back, left side neck.  She is unable to raise her arm to the side.  Good circulation and sensation.  Says her left thumb hurts to bend as well.

## 2019-12-27 NOTE — ED Provider Notes (Signed)
[facility] Emergency Department Provider Note  ____________________________________________   None    (approximate)  I have reviewed the triage vital signs and the nursing notes.   HISTORY  Chief Complaint Shoulder Pain   HPI Felicia Jackson is a 49 y.o. female presents to the ED with complaint of left shoulder pain for 6 days.  Patient states that it hurts in her shoulder, left upper back and left side of her neck.  Pain is increased with range of motion and she is also noticed that she is unable to raise her arm up secondary to pain.  She denies any injury to her shoulder both recently and in the past.  Also her initial blood pressure in triage was 215/104.  Patient states that she does not have a history of hypertension but states that she has been told before that it was elevated.  She denies any difficulty with headaches, chest pain, shortness of breath, dizziness or vision changes.  She does not have a PCP.  Currently she rates her pain as 6/10.       Past Medical History:  Diagnosis Date  . Hypertension     Patient Active Problem List   Diagnosis Date Noted  . Iron deficiency anemia 08/12/2018  . B12 deficiency 08/12/2018    History reviewed. No pertinent surgical history.  Prior to Admission medications   Medication Sig Start Date End Date Taking? Authorizing Provider  Cyanocobalamin (B-12) 100 MCG TABS Take 1 tablet by mouth daily.   Yes [provider]  ferrous sulfate 325 (65 FE) MG tablet Take 325 mg by mouth 2 (two) times daily with a meal.    Yes [provider]  ibuprofen (ADVIL,MOTRIN) 100 MG/5ML suspension Take 200 mg by mouth every 6 (six) hours as needed for moderate pain.   Yes [provider]  hydrochlorothiazide (HYDRODIURIL) 25 MG tablet Take 1 tablet (25 mg total) by mouth daily. 12/27/19   Johnn Hai, PA-C  predniSONE (DELTASONE) 10 MG tablet Take 6 tablets  today, on day 2 take 5 tablets, day  3 take 4 tablets, day 4 take 3 tablets, day 5 take  2 tablets and 1 tablet the last day 12/27/19   Johnn Hai, PA-C    Allergies Patient has no known allergies.  Family History  Problem Relation Age of Onset  . Heart attack Mother   . Hypertension Father     Social History Social History   Tobacco Use  . Smoking status: Never Smoker  . Smokeless tobacco: Never Used  Substance Use Topics  . Alcohol use: Not Currently  . Drug use: Never    Review of Systems Constitutional: No fever/chills Eyes: No visual changes. ENT: No complaints. Cardiovascular: Denies chest pain. Respiratory: Denies shortness of breath. Gastrointestinal:  No nausea, no vomiting.   Musculoskeletal: Left shoulder pain. Skin: Negative for rash. Neurological: Negative for headaches, focal weakness or numbness. ___________________________________________   PHYSICAL EXAM:  VITAL SIGNS: ED Triage Vitals  Enc Vitals Group     BP 12/27/19 0730 (!) 215/104     Pulse Rate 12/27/19 0730 75     Resp 12/27/19 0730 17     Temp 12/27/19 0730 98.4 F (36.9 C)     Temp Source 12/27/19 0730 Oral     SpO2 12/27/19 0730 100 %     Weight 12/27/19 0731 195 lb (88.5 kg)     Height 12/27/19 0731 5\' 7"  (1.702 m)     Head  Circumference --      Peak Flow --      Pain Score 12/27/19 0731 6     Pain Loc --      Pain Edu? --      Excl. in GC? --     Constitutional: Alert and oriented. Well appearing and in no acute distress. Eyes: Conjunctivae are normal.  Head: Atraumatic. Neck: No stridor.   Cardiovascular: Normal rate, regular rhythm. Grossly normal heart sounds.  Good peripheral circulation. Respiratory: Normal respiratory effort.  No retractions. Lungs CTAB. Gastrointestinal: Soft and nontender. No distention. No abdominal bruits. No CVA tenderness. Musculoskeletal: On examination of left shoulder there is no gross deformity.  Range of motion without crepitus.  Patient does have difficulty with  abduction of her left shoulder and stops at approximately 45 degrees.  There is tenderness on palpation of the trapezius muscle and anterior AC joint area.. Neurologic:  Normal speech and language. No gross focal neurologic deficits are appreciated. No gait instability. Skin:  Skin is warm, dry and intact. No rash noted. Psychiatric: Mood and affect are normal. Speech and behavior are normal.  ____________________________________________   LABS (all labs ordered are listed, but only abnormal results are displayed)  Labs Reviewed - No data to display  RADIOLOGY   Official radiology report(s): DG Shoulder Left  Result Date: 12/27/2019 CLINICAL DATA:  Left shoulder pain x2 weeks EXAM: LEFT SHOULDER - 2+ VIEW COMPARISON:  None. FINDINGS: No fracture or dislocation is seen. The joint spaces are preserved. The visualized soft tissues are unremarkable. IMPRESSION: Negative. Electronically Signed   By: Charline Bills M.D.   On: 12/27/2019 09:31    ____________________________________________   PROCEDURES  Procedure(s) performed (including Critical Care):  Procedures   ____________________________________________   INITIAL IMPRESSION / ASSESSMENT AND PLAN / ED COURSE  As part of my medical decision making, I reviewed the following data within the electronic MEDICAL RECORD NUMBER Notes from prior ED visits and [place] Controlled Substance Database     49 year old female presents to the ED with complaint of left shoulder pain for approximately 2 weeks.  Patient is unaware of any injury but states that she has noticed that her range of motion in her shoulder has decreased and it is painful to raise her arm above her head.  Patient has been taking over-the-counter medication without any relief.  Her initial blood pressure on arrival to the ED was also elevated at 215/104.  Patient denied any cardiac history and records indicate that she has had hypertension in the past.  Patient states she is not  currently taking any blood pressure medication.  She denies any shortness of breath, chest pain, dizziness, headache.  Patient x-rays did not show any bony abnormality.  Most likely this is consistent with a bursitis.  Patient was encouraged to continue range of motion and abduction walking her fingers up the wall.  Ice/heat for comfort.  A prescription for prednisone was sent to her pharmacy as well as hydrochlorothiazide 25 mg 1 daily.  She is strongly encouraged to get a PCP and a list of clinics in the area were listed on her discharge papers.  Patient also follow-up with Dr. Allena Katz who is on-call for orthopedics if any continued problems or not improving.  Blood pressure prior to discharge was 163/97.     ___________________________________________   FINAL CLINICAL IMPRESSION(S) / ED DIAGNOSES  Final diagnoses:  Acute shoulder bursitis, left  Elevated blood pressure reading     ED Discharge Orders  Ordered    predniSONE (DELTASONE) 10 MG tablet     12/27/19 0951    hydrochlorothiazide (HYDRODIURIL) 25 MG tablet  Daily     12/27/19 0951           Note:  This document was prepared using Dragon voice recognition software and may include unintentional dictation errors.    Tommi Rumps, PA-C 12/27/19 1401    Shaune Pollack, MD 12/27/19 2032

## 2019-12-27 NOTE — Discharge Instructions (Addendum)
Begin calling the clinics listed on your discharge papers to see if anyone is taking new patients.  You definitely need to have your blood pressure checked often.  The medication prescribed for you is a mild fluid pill which may reduce your blood pressure but most likely will not be the only medication that you need.  Begin taking prednisone as directed.  You may also take Tylenol with this medication if needed for additional pain control however do not take any of the ibuprofen with this medication.  You may use ice or heat to your shoulder as needed for discomfort.  Also begin doing the exercises on the wall at your home to keep mobility in your shoulder.  You should also follow-up with the orthopedist if any worsening or not improving.

## 2019-12-27 NOTE — Progress Notes (Signed)
[facility]  [address] [place], [place] [address] Phone: [phone]  Fax: [phone]   Clinic Day:  12/29/2019  Referring physician: No ref. provider found  Chief Complaint: Felicia Jackson is a 49 y.o. female with with iron deficiency and B12 deficiency who is seen for reassessment.  HPI: The patient was last seen in the hematology clinic on 10/21/2018. At that time, she was doing well. Energy level had improved. Exam was unremarkable. She received her 5th weekly B-12 injection. Hematocrit was 41.5, hemoglobin 12.0, MCV 82.0, platelets 305,000, WBC 4000, ANC 2200.  Intrinsic factor antibodies were 1.1.  Anti-parietal antibody was 34.8 (high) and c/w pernicious anemia.  Ferritin was 12. She continued oral iron.  She was scheduled for follow-up in 3 months.  She was lost to follow-up.  She was seen in the ER at [facility] on 12/27/2019 for left shoulder pain x 6 days. Bood pressure was 215/104.  Patient was not taking any blood pressure medication.  Plain films of the left shoulder revealed no fracture or dislocation.  The joint spaces were preserved. She was felt to have bursitis.  She was prescribed prednisone and HCTZ.   Symptomatically, she is feeling good today. She says she made this appointment herself for today. Her health has been okay over the last year. Her energy has been well. Her diet has been good. She is eating more since she is cooking more at home. She is avoiding fried foods. She denies any bleeding in her stool or urine. She is not regularly having periods; she has periods every 3-4 months. She has night sweats every once in a while. She has been taking 1000 mcg of B-12 once a day. She denies reflux and urinary symptoms. She has left scapula pain. She has an appointment set up with her PCP in regards to her elevated BP. She is also interested in having a colonoscopy done. She is currently taking 65 mg of elemental oral iron twice a day.   She  received the Pfizer COVID-19 vaccine; last dose was on 12/07/2019.    Past Medical History:  Diagnosis Date  . Hypertension     No past surgical history on file.  Family History  Problem Relation Age of Onset  . Heart attack Mother   . Hypertension Father     Social History:  reports that she has never smoked. She has never used smokeless tobacco. She reports previous alcohol use. She reports that she does not use drugs. 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  Patient denies known exposures to radiation on toxins. The patient is alone  today.  Allergies: No Known Allergies  Current Medications: Current Outpatient Medications  Medication Sig Dispense Refill  . ferrous sulfate 325 (65 FE) MG tablet Take 325 mg by mouth 2 (two) times daily with a meal.     . hydrochlorothiazide (HYDRODIURIL) 25 MG tablet Take 1 tablet (25 mg total) by mouth daily. 30 tablet 1  . ibuprofen (ADVIL,MOTRIN) 100 MG/5ML suspension Take 200 mg by mouth every 6 (six) hours as needed for moderate pain.    . predniSONE (DELTASONE) 10 MG tablet Take 6 tablets  today, on day 2 take 5 tablets, day 3 take 4 tablets, day 4 take 3 tablets, day 5 take  2 tablets and 1 tablet the last day 21 tablet 0  . vitamin B-12 (CYANOCOBALAMIN) 1000 MCG tablet Take 1,000 mcg by mouth daily.     No current  facility-administered medications for this visit.    Review of Systems  Constitutional: Negative for chills, diaphoresis, fever, malaise/fatigue and weight loss (up 4 lb).       Feels good.  HENT: Negative for congestion, ear pain, hearing loss, sore throat and tinnitus.   Eyes: Negative for blurred vision and double vision.  Respiratory: Negative for cough and shortness of breath.   Cardiovascular: Negative for chest pain, palpitations and leg swelling.  Gastrointestinal: Negative for abdominal pain, blood in stool, constipation, diarrhea, heartburn, melena, nausea and vomiting.  Genitourinary: Negative for  dysuria, frequency and urgency.  Musculoskeletal: Positive for joint pain (shoulder). Negative for back pain, falls, myalgias and neck pain.  Skin: Negative for itching and rash.  Neurological: Negative for dizziness, weakness and headaches.  Endo/Heme/Allergies: Does not bruise/bleed easily.  Psychiatric/Behavioral: Negative for depression. The patient is not nervous/anxious and does not have insomnia.   All other systems reviewed and are negative.  Performance status (ECOG): 0 - Asymptomatic  Vitals Blood pressure (!) 160/94, pulse 62, temperature 98.5 F (36.9 C), temperature source Oral, resp. rate 16, weight 197 lb 8.5 oz (89.6 kg), SpO2 100 %.   Physical Exam  Constitutional: She is oriented to person, place, and time. She appears well-developed and well-nourished. No distress. Face mask in place.  HENT:  Head: Normocephalic and atraumatic.  Right Ear: Hearing normal.  Left Ear: Hearing normal.  Mouth/Throat: Oropharynx is clear and moist and mucous membranes are normal. No oral lesions.  Shoulder length dark hair.   Eyes: Pupils are equal, round, and reactive to light. Conjunctivae and EOM are normal. Right eye exhibits no discharge. Left eye exhibits no discharge. No scleral icterus.  Brown eyes. Glasses.   Neck: No JVD present.  Cardiovascular: Normal rate, regular rhythm and normal heart sounds. Exam reveals no gallop and no friction rub.  No murmur heard. Pulmonary/Chest: Effort normal and breath sounds normal. She has no wheezes. She has no rhonchi. She has no rales.  Abdominal: Soft. Normal appearance and bowel sounds are normal. She exhibits no mass. There is no hepatosplenomegaly. There is no abdominal tenderness. There is no rebound, no guarding and no CVA tenderness.  Musculoskeletal:        General: No tenderness or edema. Normal range of motion.     Cervical back: Normal range of motion and neck supple.  Lymphadenopathy:    She has no cervical adenopathy.        Right cervical: No superficial cervical adenopathy present.   She has no axillary adenopathy.       Right: No inguinal adenopathy present.       Left: No inguinal adenopathy present.  Neurological: She is alert and oriented to person, place, and time.  Skin: Skin is warm, dry and intact. No bruising, no lesion and no rash noted. She is not diaphoretic. No erythema. No pallor.  Psychiatric: She has a normal mood and affect. Her behavior is normal. Judgment and thought content normal.    No visits with results within 3 Day(s) from this visit.  Latest known visit with results is:  Orders Only on 10/21/2018  Component Date Value Ref Range Status  . Parietal Cell Antibody-IgG 10/21/2018 34.8* 0.0 - 20.0 Units Final   Comment: (NOTE)                                Negative    0.0 - 20.0  Equivocal  20.1 - 24.9                                Positive         >24.9 Parietal Cell Antibodies are found in 90% of patients with pernicious anemia and 30% of first degree relatives with pernicious anemia. Performed At: [facility] [place], [place] [id number] Rush Farmer MD UV:[id number]   . Intrinsic Factor 10/21/2018 1.1  0.0 - 1.1 AU/mL Final   Comment: (NOTE) Performed At: [facility] [place], [place] [id number] Rush Farmer MD [id number]   . Ferritin 10/21/2018 12  11 - 307 ng/mL Final   Performed at [facility], [address] [place], [place] [address]  . WBC 10/21/2018 4.0  4.0 - 10.5 K/uL Final  . RBC 10/21/2018 5.06  3.87 - 5.11 MIL/uL Final  . Hemoglobin 10/21/2018 12.0  12.0 - 15.0 g/dL Final  . HCT 10/21/2018 41.5  36.0 - 46.0 % Final  . MCV 10/21/2018 82.0  80.0 - 100.0 fL Final  . MCH 10/21/2018 23.7* 26.0 - 34.0 pg Final  . MCHC 10/21/2018 28.9* 30.0 - 36.0 g/dL Final  . RDW 10/21/2018 22.7* 11.5 - 15.5 % Final  . Platelets 10/21/2018 305  150 - 400 K/uL Final  . nRBC 10/21/2018  0.0  0.0 - 0.2 % Final  . Neutrophils Relative % 10/21/2018 55  % Final  . Neutro Abs 10/21/2018 2.2  1.7 - 7.7 K/uL Final  . Lymphocytes Relative 10/21/2018 33  % Final  . Lymphs Abs 10/21/2018 1.4  0.7 - 4.0 K/uL Final  . Monocytes Relative 10/21/2018 9  % Final  . Monocytes Absolute 10/21/2018 0.4  0.1 - 1.0 K/uL Final  . Eosinophils Relative 10/21/2018 2  % Final  . Eosinophils Absolute 10/21/2018 0.1  0.0 - 0.5 K/uL Final  . Basophils Relative 10/21/2018 1  % Final  . Basophils Absolute 10/21/2018 0.0  0.0 - 0.1 K/uL Final  . Immature Granulocytes 10/21/2018 0  % Final  . Abs Immature Granulocytes 10/21/2018 0.01  0.00 - 0.07 K/uL Final   Performed at [facility], [address], [place], [place] [address]    Assessment:  Memori Sammon is a 49 y.o. female with iron deficiency and B12 deficiency.  He has heavy menses.  Diet is good.  Labs on 07/21/2018 revealed a hematocrit of 29.6, hemoglobin 7.6, MCV 66.6, platelets 414,000, WBC 5100 with an [place] of 2420.  Normal studies included: TSH, creatinine, and LFTs.  Ferritin was 2 with an iron saturation of < 2% and a TIBC of 492.5 (high).  B12 was 66.  Work-up on 08/12/2018 revealed a hematocrit of 25.6, hemoglobin 6.9, MCV 64.5, platelets 377,000, WBC 2900 with an ANC of 1300.  Retic was 1.3%.  Folate was 20.7.  She received 1 unit of PRBCs on 08/12/2018.  She is on oral iron.  Ferritin has been followed: 2 on 07/21/2018 and 9 on 09/12/2018.  She has B12 deficiency.  B12 was 66 on 07/21/2018 and 172 on 09/12/2018.  She was on oral B12.  She began weekly B12 on 09/13/2018 (last 10/11/2018).  Anti-parietal antibody was 34.8 (high) on 10/21/2018 and c/w pernicious anemia.   She received the [place] COVID-19 vaccine; last dose was on 12/07/2019.   Symptomatically, she feels good.  Diet is good.  She denies any bleeding.  Exam is unremarkable.  Plan: 1.  Labs today:  CBC with diff, ferritin, iron studies, B12, folate. 2.    Iron deficiency anemia             Hematocrit 42.8.  Hemoglobin 13.7.  MCV 85.3.                 Ferritin 39.  Ferritin goal is 100.             She is on oral iron twice daily. 3.   B12 deficiency             B12 level was 172 on 09/12/2018.             She is on B12 at 1000 mcg p.o. daily   She has pernicious anemia. 4.   Call patient with lab results. 5.   If B12 low, begin B12 injections. 6.   RTC in 6 months for MD assessment and labs (CBC with diff, feritin, iron studies).  I discussed the assessment and treatment plan with the patient.  The patient was provided an opportunity to ask questions and all were answered.  The patient agreed with the plan and demonstrated an understanding of the instructions.  The patient was advised to call back if the symptoms worsen or if the condition fails to improve as anticipated.   Felicia Bath, MD, PhD    12/29/2019, 11:11 AM  I, Wandalee Ferdinand, am acting as Neurosurgeon for [employer]. Merlene Pulling, MD, PhD.  I, Nathanael Krist C. Merlene Pulling, MD, have reviewed the above documentation for accuracy and completeness, and I agree with the above.

## 2019-12-27 NOTE — ED Triage Notes (Signed)
Pt c/o of left shoulder and neck pain for the past 2 weeks, unsure of any thing in particular that she did to injure it,. States "I think I pulled a muscle".

## 2019-12-29 ENCOUNTER — Inpatient Hospital Stay: Payer: Federal, State, Local not specified - PPO

## 2019-12-29 ENCOUNTER — Inpatient Hospital Stay
Payer: Federal, State, Local not specified - PPO | Attending: Hematology and Oncology | Admitting: Hematology and Oncology

## 2019-12-29 ENCOUNTER — Other Ambulatory Visit: Payer: Self-pay

## 2019-12-29 VITALS — BP 160/94 | HR 62 | Temp 98.5°F | Resp 16 | Wt 197.5 lb

## 2019-12-29 DIAGNOSIS — D510 Vitamin B12 deficiency anemia due to intrinsic factor deficiency: Secondary | ICD-10-CM | POA: Insufficient documentation

## 2019-12-29 DIAGNOSIS — D509 Iron deficiency anemia, unspecified: Secondary | ICD-10-CM | POA: Insufficient documentation

## 2019-12-29 DIAGNOSIS — E538 Deficiency of other specified B group vitamins: Secondary | ICD-10-CM | POA: Diagnosis not present

## 2019-12-29 LAB — CBC WITH DIFFERENTIAL/PLATELET
Abs Immature Granulocytes: 0.02 10*3/uL (ref 0.00–0.07)
Basophils Absolute: 0 10*3/uL (ref 0.0–0.1)
Basophils Relative: 1 %
Eosinophils Absolute: 0 10*3/uL (ref 0.0–0.5)
Eosinophils Relative: 0 %
HCT: 42.8 % (ref 36.0–46.0)
Hemoglobin: 13.7 g/dL (ref 12.0–15.0)
Immature Granulocytes: 0 %
Lymphocytes Relative: 14 %
Lymphs Abs: 0.9 10*3/uL (ref 0.7–4.0)
MCH: 27.3 pg (ref 26.0–34.0)
MCHC: 32 g/dL (ref 30.0–36.0)
MCV: 85.3 fL (ref 80.0–100.0)
Monocytes Absolute: 0.2 10*3/uL (ref 0.1–1.0)
Monocytes Relative: 4 %
Neutro Abs: 5.1 10*3/uL (ref 1.7–7.7)
Neutrophils Relative %: 81 %
Platelets: 309 10*3/uL (ref 150–400)
RBC: 5.02 MIL/uL (ref 3.87–5.11)
RDW: 14 % (ref 11.5–15.5)
WBC: 6.3 10*3/uL (ref 4.0–10.5)
nRBC: 0 % (ref 0.0–0.2)

## 2019-12-29 LAB — VITAMIN B12: Vitamin B-12: 4214 pg/mL — ABNORMAL HIGH (ref 180–914)

## 2019-12-29 LAB — IRON AND TIBC
Iron: 69 ug/dL (ref 28–170)
Saturation Ratios: 22 % (ref 10.4–31.8)
TIBC: 321 ug/dL (ref 250–450)
UIBC: 252 ug/dL

## 2019-12-29 LAB — FERRITIN: Ferritin: 39 ng/mL (ref 11–307)

## 2019-12-29 LAB — FOLATE: Folate: 11 ng/mL (ref >5.9)

## 2019-12-29 NOTE — Progress Notes (Signed)
Patient here for follow up. Patient states she was seen in ED on Wednesday for Shoulder and Neck Pain. Reports she was DX with HTN and placed on HCTZ. She has a f/u with her PCP. Denies any other concerns.

## 2019-12-31 ENCOUNTER — Other Ambulatory Visit: Payer: Self-pay

## 2019-12-31 ENCOUNTER — Telehealth: Payer: Self-pay

## 2019-12-31 DIAGNOSIS — E538 Deficiency of other specified B group vitamins: Secondary | ICD-10-CM

## 2019-12-31 NOTE — Telephone Encounter (Signed)
Left a message To inform the patient Per Dr Merlene Pulling her B-12 levels are to high , hold B-12 x 3 weeks, then start 3 times a week. We will recheck B-12 levels in 6 weeks. I have inform the patient to call back to the office if she has any concerns.

## 2019-12-31 NOTE — Telephone Encounter (Signed)
-----   Message from Rosey Bath, MD sent at 12/29/2019  5:40 PM EDT ----- Regarding: Please call patient  B12 is high.  She does not need to take B12 every day.  Hold B12 x 3 weeks then begin B12 3 times a week.  Check B12 in 6 weeks.  M ----- Message ----- From: Leory Plowman, Lab In [place] Sent: 12/29/2019  11:43 AM EDT To: Rosey Bath, MD

## 2020-02-09 ENCOUNTER — Other Ambulatory Visit: Payer: Self-pay

## 2020-02-09 ENCOUNTER — Inpatient Hospital Stay: Payer: Federal, State, Local not specified - PPO | Attending: Hematology and Oncology

## 2020-02-09 DIAGNOSIS — D509 Iron deficiency anemia, unspecified: Secondary | ICD-10-CM | POA: Diagnosis not present

## 2020-02-09 DIAGNOSIS — E538 Deficiency of other specified B group vitamins: Secondary | ICD-10-CM | POA: Diagnosis not present

## 2020-02-09 LAB — VITAMIN B12: Vitamin B-12: 3786 pg/mL — ABNORMAL HIGH (ref 180–914)

## 2020-04-01 ENCOUNTER — Telehealth: Payer: Self-pay | Admitting: Family Medicine

## 2020-04-01 NOTE — Telephone Encounter (Signed)
Patient is calling to cancel her new patient appt since Dr. B has not returned as of yet. Patient is interested in being contacted when Dr. B does return for a new patient appt. Please advise CB- [phone]

## 2020-04-04 ENCOUNTER — Ambulatory Visit: Payer: Self-pay | Admitting: Family Medicine

## 2020-06-12 ENCOUNTER — Telehealth: Payer: Self-pay | Admitting: Hematology and Oncology

## 2020-06-12 NOTE — Telephone Encounter (Signed)
I called to let the pateint know we are closed on Fridays I needed to move her appt or make it Virtual on Friday morning.

## 2020-06-14 ENCOUNTER — Other Ambulatory Visit: Payer: Federal, State, Local not specified - PPO

## 2020-06-14 ENCOUNTER — Ambulatory Visit: Payer: Federal, State, Local not specified - PPO | Admitting: Hematology and Oncology

## 2021-02-03 DIAGNOSIS — Z7251 High risk heterosexual behavior: Secondary | ICD-10-CM | POA: Diagnosis not present

## 2021-03-12 ENCOUNTER — Encounter: Payer: Self-pay | Admitting: Hematology and Oncology

## 2021-03-13 ENCOUNTER — Other Ambulatory Visit: Payer: Self-pay

## 2021-03-13 ENCOUNTER — Encounter: Payer: Self-pay | Admitting: Internal Medicine

## 2021-03-13 ENCOUNTER — Ambulatory Visit: Payer: Federal, State, Local not specified - PPO | Admitting: Internal Medicine

## 2021-03-13 VITALS — BP 152/86 | HR 80 | Temp 97.5°F | Resp 17 | Ht 67.0 in | Wt 200.4 lb

## 2021-03-13 DIAGNOSIS — D508 Other iron deficiency anemias: Secondary | ICD-10-CM

## 2021-03-13 DIAGNOSIS — E6609 Other obesity due to excess calories: Secondary | ICD-10-CM | POA: Diagnosis not present

## 2021-03-13 DIAGNOSIS — M778 Other enthesopathies, not elsewhere classified: Secondary | ICD-10-CM

## 2021-03-13 DIAGNOSIS — I10 Essential (primary) hypertension: Secondary | ICD-10-CM

## 2021-03-13 DIAGNOSIS — Z6832 Body mass index (BMI) 32.0-32.9, adult: Secondary | ICD-10-CM | POA: Insufficient documentation

## 2021-03-13 DIAGNOSIS — Z6831 Body mass index (BMI) 31.0-31.9, adult: Secondary | ICD-10-CM

## 2021-03-13 DIAGNOSIS — M71379 Other bursal cyst, unspecified ankle and foot: Secondary | ICD-10-CM

## 2021-03-13 MED ORDER — HYDROCHLOROTHIAZIDE 25 MG PO TABS: 25.0000 mg | ORAL_TABLET | Freq: Every day | ORAL | 0 refills | Status: DC

## 2021-03-13 MED ORDER — PREDNISONE 10 MG PO TABS: ORAL_TABLET | ORAL | 0 refills | Status: DC

## 2021-03-13 NOTE — Assessment & Plan Note (Signed)
Encouraged diet and exercise for weight loss ?

## 2021-03-13 NOTE — Assessment & Plan Note (Signed)
Continue oral iron Monitor CBC yearly

## 2021-03-13 NOTE — Progress Notes (Signed)
HPI  Pt presents to the clinic today to establish care and for management of the conditions listed below.   Her last H/H was 13.7/42.8, 12/2019. She is taking an oral iron supplement. She does not follow with hematology.  HTN: her BP today is 152/86. She is not taking HCTZ as prescribed. There is no ECG on file.   She also reports left wrist pain and swelling. She reports this started 2 weeks ago. She describes the pain as throbbing. She reports the swelling gets worse throughout the day. She denies numbness, tingling or injury to the area. She does do repetitive motions for work. She is left handed. She has tried ice and Motrin with minimal relief of symptoms.   She also reports a cyst on the top of her right foot. She noticed this 1 week ago. The cyst is not painful. She has not noticed any redness, swelling or drainage from the area. She has not tried anything OTC for this.   Past Medical History:  Diagnosis Date   Hypertension     Current Outpatient Medications  Medication Sig Dispense Refill   ferrous sulfate 325 (65 FE) MG tablet Take 325 mg by mouth 2 (two) times daily with a meal.      hydrochlorothiazide (HYDRODIURIL) 25 MG tablet Take 1 tablet (25 mg total) by mouth daily. 30 tablet 1   ibuprofen (ADVIL,MOTRIN) 100 MG/5ML suspension Take 200 mg by mouth every 6 (six) hours as needed for moderate pain.     predniSONE (DELTASONE) 10 MG tablet Take 6 tablets  today, on day 2 take 5 tablets, day 3 take 4 tablets, day 4 take 3 tablets, day 5 take  2 tablets and 1 tablet the last day 21 tablet 0   vitamin B-12 (CYANOCOBALAMIN) 1000 MCG tablet Take 1,000 mcg by mouth daily.     No current facility-administered medications for this visit.    No Known Allergies  Family History  Problem Relation Age of Onset   Heart attack Mother    Hypertension Father     Social History   Socioeconomic History   Marital status: Widowed    Spouse name: Not on file   Number of children: Not on  file   Years of education: Not on file   Highest education level: Not on file  Occupational History   Not on file  Tobacco Use   Smoking status: Never   Smokeless tobacco: Never  Substance and Sexual Activity   Alcohol use: Not Currently   Drug use: Never   Sexual activity: Not on file  Other Topics Concern   Not on file  Social History Narrative   Not on file   Social Determinants of Health   Financial Resource Strain: Not on file  Food Insecurity: Not on file  Transportation Needs: Not on file  Physical Activity: Not on file  Stress: Not on file  Social Connections: Not on file  Intimate Partner Violence: Not on file    ROS:  Constitutional: Denies fever, malaise, fatigue, headache or abrupt weight changes.  HEENT: Denies eye pain, eye redness, ear pain, ringing in the ears, wax buildup, runny nose, nasal congestion, bloody nose, or sore throat. Respiratory: Denies difficulty breathing, shortness of breath, cough or sputum production.   Cardiovascular: Denies chest pain, chest tightness, palpitations or swelling in the hands or feet.  Gastrointestinal: Denies abdominal pain, bloating, constipation, diarrhea or blood in the stool.  GU: Denies frequency, urgency, pain with urination, blood in urine,  odor or discharge. Musculoskeletal: Pt reports left wrist pain and swelling. Denies decrease in range of motion, difficulty with gait, muscle pain.  Skin: Pt reports cyst of right foot. Denies redness, rashes, lesions or ulcercations.  Neurological: Denies dizziness, difficulty with memory, difficulty with speech or problems with balance and coordination.  Psych: Denies anxiety, depression, SI/HI.  No other specific complaints in a complete review of systems (except as listed in HPI above).  BP (!) 152/86 (BP Location: Right Arm, Patient Position: Sitting, Cuff Size: Large)   Pulse 80   Temp (!) 97.5 F (36.4 C) (Temporal)   Resp 17   Ht 5\' 7"  (1.702 m)   Wt 200 lb 6.4 oz  (90.9 kg)   SpO2 100%   BMI 31.39 kg/m   Wt Readings from Last 3 Encounters:  12/29/19 197 lb 8.5 oz (89.6 kg)  12/27/19 195 lb (88.5 kg)  10/21/18 195 lb 4.8 oz (88.6 kg)    General: Appears her stated age, obese, in NAD. Skin: Soft, round raised cyst noted over right foot, overlying the proximal 4th metatarasal. HEENT: Head: normal shape and size; Eyes: sclera white and EOMs intact;  Cardiovascular: Normal rate and rhythm. S1,S2 noted.  No murmur, rubs or gallops noted. No JVD or BLE edema.  Pulmonary/Chest: Normal effort and positive vesicular breath sounds. No respiratory distress. No wheezes, rales or ronchi noted.  Musculoskeletal: Normal flexion, extension and rotation of the left wrist. Localized swelling noted over the distal ulna. Hand grips equal. No difficulty with gait.  Neurological: Alert and oriented. Cranial nerves II-XII grossly intact. Coordination normal.  Psychiatric: Mood and affect normal. Behavior is normal. Judgment and thought content normal.    CBC    Component Value Date/Time   WBC 6.3 12/29/2019 1133   RBC 5.02 12/29/2019 1133   HGB 13.7 12/29/2019 1133   HCT 42.8 12/29/2019 1133   PLT 309 12/29/2019 1133   MCV 85.3 12/29/2019 1133   MCH 27.3 12/29/2019 1133   MCHC 32.0 12/29/2019 1133   RDW 14.0 12/29/2019 1133   LYMPHSABS 0.9 12/29/2019 1133   MONOABS 0.2 12/29/2019 1133   EOSABS 0.0 12/29/2019 1133   BASOSABS 0.0 12/29/2019 1133      Assessment and Plan:  Left Wrist Pain and Swelling:  Likely tendonitis Encouraged ice Advised her to wear a brace at work RX for Pred taper x 8 days Consider xray if symptoms persist or worsen  Cyst of Right Foot:  Not causing any pain or decreased ROM Will monitor for now  RTC in 2 weeks for her annual exam 12/31/2019  This visit occurred during the SARS-CoV-2 public health emergency.  Safety protocols were in place, including screening questions prior to the visit, additional usage of staff  PPE, and extensive cleaning of exam room while observing appropriate contact time as indicated for disinfecting solutions.     Nicki Reaper, NP This visit occurred during the SARS-CoV-2 public health emergency.  Safety protocols were in place, including screening questions prior to the visit, additional usage of staff PPE, and extensive cleaning of exam room while observing appropriate contact time as indicated for disinfecting solutions.

## 2021-03-13 NOTE — Assessment & Plan Note (Signed)
Uncontrolled off meds Refilled HCTZ  RTC in 2 weeks for annual exam/bp check

## 2021-03-13 NOTE — Patient Instructions (Signed)
Extensor Pollicis Longus Tendinitis  Tendinitis is inflammation of a tendon--the tissues that connect muscles to bone. The extensor pollicis longus (EPL) tendon is a tendon that runs from the mid-forearm to the mid-thumb area. This tendon helps to pull the thumb up andback. EPL tendinitis occurs when the lining (sheath) of the EPL tendon becomes irritated and swollen at the back of the wrist.This causes pain on the thumb side of the back of the wrist. What are the causes? This condition may be caused by: Overusing the muscle or tendon by doing activities that repeatedly cause your thumb and wrist to extend. A sudden increase in activity or a change in activity. A previous wrist injury. What increases the risk? You are more likely to develop this condition if: You have had a broken wrist or other wrist injury. You play sports or activities that involve repeated hand and wrist motions, such as tennis, golf, bowling, and gardening. You do heavy labor. You have poor wrist strength and flexibility. What are the signs or symptoms? Symptoms of this condition include: Pain or tenderness over the thumb side of the back of the wrist when your thumb and wrist are not moving. Swelling over the affected area. Pain that gets worse when you straighten your thumb or wrist. Pain when you touch the affected area. Decreased thumb and wrist range of motion due to pain. How is this diagnosed? This condition may be diagnosed with: Your symptoms and medical history, including the details of any injury. A physical exam. An MRI. This imaging test will confirm the diagnosis. How is this treated? Treatment for this condition may include: Applying ice to the affected area. Taking medicines to lessen pain and swelling. Wearing a splint or brace to limit the movement of your thumb and wrist until your symptoms improve. Exercises to help you maintain movement in your thumb and wrist (physical therapy). Therapy to  help with everyday activities (occupational therapy). Injection of an anti-inflammatory medicine (steroid) if the other treatments are not helping. Surgery. This is only needed in severe cases. Follow these instructions at home: If you have a splint or brace: Wear it as told by your health care provider. Remove it only as told by your health care provider. Loosen it if your fingers tingle, become numb, or turn cold and blue. Keep it clean. If it is not waterproof: Do not let it get wet. Cover it with a watertight covering when you take a bath or shower. Remove it for bathing and washing your hand only if instructed by your health care provider. Managing pain, stiffness, and swelling  If directed, put ice on the injured area. If you have a removable splint or brace, remove it as told by your health care provider. Put ice in a plastic bag. Place a towel between your skin and the bag. Leave the ice on for 20 minutes, 2-3 times a day. Move your fingers often through a full range of motion to reduce stiffness and swelling. Raise (elevate) the injured area above the level of your heart while you are sitting or lying down.  Activity Rest the affected area as told by your health care provider. Return to your normal activities as told by your health care provider. Ask your health care provider what activities are safe for you. Do exercises as told by your health care provider. General instructions Take over-the-counter and prescription medicines only as told by your health care provider. Keep all follow-up visits as told by your health care  provider. This is important. Contact a health care provider if: Your pain, tenderness, or swelling gets worse, even if you have had treatment. You have numbness or tingling in your wrist, hand, or fingers on the injured side. Get help right away if: Your pain is severe. You are unable to straighten the tip of your thumb using just your  muscle. Summary EPL tendinitis occurs when the lining (sheath) of the EPL tendon becomes irritated and swollen at the back of the wrist. This causes pain on the thumb side of the back of the wrist. Treatment for this condition may include applying ice, taking medicines, wearing a splint or brace, and physical or occupational therapy. In severe cases, surgery may be needed. Take over-the-counter and prescription medicines only as told by your health care provider. Return to your normal activities as told by your health care provider. Ask your health care provider what activities are safe for you. This information is not intended to replace advice given to you by your health care provider. Make sure you discuss any questions you have with your healthcare provider. Document Revised: 12/20/2018 Document Reviewed: 10/17/2018 Elsevier Patient Education  2022 [employer].

## 2021-03-27 ENCOUNTER — Encounter: Payer: Federal, State, Local not specified - PPO | Admitting: Internal Medicine

## 2021-04-01 ENCOUNTER — Ambulatory Visit (INDEPENDENT_AMBULATORY_CARE_PROVIDER_SITE_OTHER): Payer: Federal, State, Local not specified - PPO | Admitting: Internal Medicine

## 2021-04-01 ENCOUNTER — Ambulatory Visit
Admission: RE | Admit: 2021-04-01 | Discharge: 2021-04-01 | Disposition: A | Discharge status: Home / Self Care | Payer: Federal, State, Local not specified - PPO | Source: Ambulatory Visit | Attending: Internal Medicine | Admitting: Internal Medicine

## 2021-04-01 ENCOUNTER — Other Ambulatory Visit: Payer: Self-pay

## 2021-04-01 ENCOUNTER — Encounter: Payer: Self-pay | Admitting: Internal Medicine

## 2021-04-01 ENCOUNTER — Ambulatory Visit
Admission: RE | Admit: 2021-04-01 | Discharge: 2021-04-01 | Disposition: A | Discharge status: Home / Self Care | Payer: Federal, State, Local not specified - PPO | Attending: Internal Medicine | Admitting: Internal Medicine

## 2021-04-01 VITALS — BP 128/86 | HR 81 | Temp 97.3°F | Resp 17 | Ht 67.0 in | Wt 200.8 lb

## 2021-04-01 DIAGNOSIS — Z1159 Encounter for screening for other viral diseases: Secondary | ICD-10-CM | POA: Diagnosis not present

## 2021-04-01 DIAGNOSIS — Z23 Encounter for immunization: Secondary | ICD-10-CM

## 2021-04-01 DIAGNOSIS — M19032 Primary osteoarthritis, left wrist: Secondary | ICD-10-CM | POA: Diagnosis not present

## 2021-04-01 DIAGNOSIS — M25532 Pain in left wrist: Secondary | ICD-10-CM

## 2021-04-01 DIAGNOSIS — Z1231 Encounter for screening mammogram for malignant neoplasm of breast: Secondary | ICD-10-CM

## 2021-04-01 DIAGNOSIS — Z114 Encounter for screening for human immunodeficiency virus [HIV]: Secondary | ICD-10-CM

## 2021-04-01 DIAGNOSIS — Z1211 Encounter for screening for malignant neoplasm of colon: Secondary | ICD-10-CM

## 2021-04-01 DIAGNOSIS — Z0001 Encounter for general adult medical examination with abnormal findings: Secondary | ICD-10-CM

## 2021-04-01 NOTE — Patient Instructions (Signed)
Health Maintenance, Female Adopting a healthy lifestyle and getting preventive care are important in promoting health and wellness. Ask your health care provider about: The right schedule for you to have regular tests and exams. Things you can do on your own to prevent diseases and keep yourself healthy. What should I know about diet, weight, and exercise? Eat a healthy diet  Eat a diet that includes plenty of vegetables, fruits, low-fat dairy products, and lean protein. Do not eat a lot of foods that are high in solid fats, added sugars, or sodium.  Maintain a healthy weight Body mass index (BMI) is used to identify weight problems. It estimates body fat based on height and weight. Your health care provider can help determineyour BMI and help you achieve or maintain a healthy weight. Get regular exercise Get regular exercise. This is one of the most important things you can do for your health. Most adults should: Exercise for at least 150 minutes each week. The exercise should increase your heart rate and make you sweat (moderate-intensity exercise). Do strengthening exercises at least twice a week. This is in addition to the moderate-intensity exercise. Spend less time sitting. Even light physical activity can be beneficial. Watch cholesterol and blood lipids Have your blood tested for lipids and cholesterol at 52 years of age, then havethis test every 5 years. Have your cholesterol levels checked more often if: Your lipid or cholesterol levels are high. You are older than 52 years of age. You are at high risk for heart disease. What should I know about cancer screening? Depending on your health history and family history, you may need to have cancer screening at various ages. This may include screening for: Breast cancer. Cervical cancer. Colorectal cancer. Skin cancer. Lung cancer. What should I know about heart disease, diabetes, and high blood pressure? Blood pressure and heart  disease High blood pressure causes heart disease and increases the risk of stroke. This is more likely to develop in people who have high blood pressure readings, are of African descent, or are overweight. Have your blood pressure checked: Every 3-5 years if you are 18-39 years of age. Every year if you are 40 years old or older. Diabetes Have regular diabetes screenings. This checks your fasting blood sugar level. Have the screening done: Once every three years after age 40 if you are at a normal weight and have a low risk for diabetes. More often and at a younger age if you are overweight or have a high risk for diabetes. What should I know about preventing infection? Hepatitis B If you have a higher risk for hepatitis B, you should be screened for this virus. Talk with your health care provider to find out if you are at risk forhepatitis B infection. Hepatitis C Testing is recommended for: Everyone born from 1945 through 1965. Anyone with known risk factors for hepatitis C. Sexually transmitted infections (STIs) Get screened for STIs, including gonorrhea and chlamydia, if: You are sexually active and are younger than 52 years of age. You are older than 52 years of age and your health care provider tells you that you are at risk for this type of infection. Your sexual activity has changed since you were last screened, and you are at increased risk for chlamydia or gonorrhea. Ask your health care provider if you are at risk. Ask your health care provider about whether you are at high risk for HIV. Your health care provider may recommend a prescription medicine to help   prevent HIV infection. If you choose to take medicine to prevent HIV, you should first get tested for HIV. You should then be tested every 3 months for as long as you are taking the medicine. Pregnancy If you are about to stop having your period (premenopausal) and you may become pregnant, seek counseling before you get  pregnant. Take 400 to 800 micrograms (mcg) of folic acid every day if you become pregnant. Ask for birth control (contraception) if you want to prevent pregnancy. Osteoporosis and menopause Osteoporosis is a disease in which the bones lose minerals and strength with aging. This can result in bone fractures. If you are 65 years old or older, or if you are at risk for osteoporosis and fractures, ask your health care provider if you should: Be screened for bone loss. Take a calcium or vitamin D supplement to lower your risk of fractures. Be given hormone replacement therapy (HRT) to treat symptoms of menopause. Follow these instructions at home: Lifestyle Do not use any products that contain nicotine or tobacco, such as cigarettes, e-cigarettes, and chewing tobacco. If you need help quitting, ask your health care provider. Do not use street drugs. Do not share needles. Ask your health care provider for help if you need support or information about quitting drugs. Alcohol use Do not drink alcohol if: Your health care provider tells you not to drink. You are pregnant, may be pregnant, or are planning to become pregnant. If you drink alcohol: Limit how much you use to 0-1 drink a day. Limit intake if you are breastfeeding. Be aware of how much alcohol is in your drink. In the [place] one drink equals one 12 oz bottle of beer (355 mL), one 5 oz glass of wine (148 mL), or one 1 oz glass of hard liquor (44 mL). General instructions Schedule regular health, dental, and eye exams. Stay current with your vaccines. Tell your health care provider if: You often feel depressed. You have ever been abused or do not feel safe at home. Summary Adopting a healthy lifestyle and getting preventive care are important in promoting health and wellness. Follow your health care provider's instructions about healthy diet, exercising, and getting tested or screened for diseases. Follow your health care provider's  instructions on monitoring your cholesterol and blood pressure. This information is not intended to replace advice given to you by your health care provider. Make sure you discuss any questions you have with your healthcare provider. Document Revised: 08/17/2018 Document Reviewed: 08/17/2018 Elsevier Patient Education  2022 Elsevier Inc.  

## 2021-04-01 NOTE — Progress Notes (Signed)
Subjective:    Patient ID: Felicia Jackson, female    DOB: 1970-09-28, 50 y.o.   MRN: [id number]  HPI  Patient presents the clinic today for her annual exam.  Flu: never Tetanus: > 10 years ago COVID: Pfizer x 3 Shingrix: never Pap smear: 3 years ago Mammogram: > 2 year ago Colon screening: never Vision screening: annually Dentist: as needed  Diet: She does eat meat. She consumes fruits and veggies. She does eat some fried foods. She drinks mostly water, Gatorade. Exercise: None  Review of Systems     Past Medical History:  Diagnosis Date   Hypertension     Current Outpatient Medications  Medication Sig Dispense Refill   ferrous sulfate 325 (65 FE) MG tablet Take 325 mg by mouth daily with breakfast.     hydrochlorothiazide (HYDRODIURIL) 25 MG tablet Take 1 tablet (25 mg total) by mouth daily. 30 tablet 0   predniSONE (DELTASONE) 10 MG tablet Take 3 tabs on days 1-3, 2 tabs on days 4-6, 1 tab on days 7-9 18 tablet 0   vitamin B-12 (CYANOCOBALAMIN) 1000 MCG tablet Take 1,000 mcg by mouth daily.     No current facility-administered medications for this visit.    No Known Allergies  Family History  Problem Relation Age of Onset   Heart attack Mother    Hypertension Father     Social History   Socioeconomic History   Marital status: Widowed    Spouse name: Not on file   Number of children: Not on file   Years of education: Not on file   Highest education level: Not on file  Occupational History   Not on file  Tobacco Use   Smoking status: Never   Smokeless tobacco: Never  Vaping Use   Vaping Use: Never used  Substance and Sexual Activity   Alcohol use: Not Currently   Drug use: Never   Sexual activity: Not on file  Other Topics Concern   Not on file  Social History Narrative   Not on file   Social Determinants of Health   Financial Resource Strain: Not on file  Food Insecurity: Not on file  Transportation Needs: Not on file  Physical Activity:  Not on file  Stress: Not on file  Social Connections: Not on file  Intimate Partner Violence: Not on file     Constitutional: Denies fever, malaise, fatigue, headache or abrupt weight changes.  HEENT: Denies eye pain, eye redness, ear pain, ringing in the ears, wax buildup, runny nose, nasal congestion, bloody nose, or sore throat. Respiratory: Denies difficulty breathing, shortness of breath, cough or sputum production.   Cardiovascular: Denies chest pain, chest tightness, palpitations or swelling in the hands or feet.  Gastrointestinal: Denies abdominal pain, bloating, constipation, diarrhea or blood in the stool.  GU: Denies urgency, frequency, pain with urination, burning sensation, blood in urine, odor or discharge. Musculoskeletal: Pt reports persistent left wrist pain. Denies decrease in range of motion, difficulty with gait, muscle pain or joint swelling.  Skin: Denies redness, rashes, lesions or ulcercations.  Neurological: Denies dizziness, difficulty with memory, difficulty with speech or problems with balance and coordination.  Psych: Denies anxiety, depression, SI/HI.  No other specific complaints in a complete review of systems (except as listed in HPI above).  Objective:   Physical Exam  BP 128/86 (BP Location: Right Arm, Patient Position: Sitting, Cuff Size: Normal)   Pulse 81   Temp (!) 97.3 F (36.3 C) (Temporal)   Resp 17  Ht 5\' 7"  (1.702 m)   Wt 200 lb 12.8 oz (91.1 kg)   SpO2 99%   BMI 31.45 kg/m   Wt Readings from Last 3 Encounters:  03/13/21 200 lb 6.4 oz (90.9 kg)  12/29/19 197 lb 8.5 oz (89.6 kg)  12/27/19 195 lb (88.5 kg)    General: Appears their stated age, obese,  in NAD. Skin: Warm, dry and intact. No rashes noted. HEENT: Head: normal shape and size; Eyes: sclera white and EOMs intact; Neck:  Neck supple, trachea midline. No masses, lumps or thyromegaly present.  Cardiovascular: Normal rate and rhythm. S1,S2 noted.  No murmur, rubs or gallops  noted. No JVD or BLE edema. No carotid bruits noted. Pulmonary/Chest: Normal effort and positive vesicular breath sounds. No respiratory distress. No wheezes, rales or ronchi noted.  Abdomen: Soft and nontender. Normal bowel sounds. No distention or masses noted. Liver, spleen and kidneys non palpable. Musculoskeletal: Strength 5/5 BUE/BLE. No difficulty with gait.  Neurological: Alert and oriented.  Psychiatric: Mood and affect normal. Behavior is normal. Judgment and thought content normal.    CBC    Component Value Date/Time   WBC 6.3 12/29/2019 1133   RBC 5.02 12/29/2019 1133   HGB 13.7 12/29/2019 1133   HCT 42.8 12/29/2019 1133   PLT 309 12/29/2019 1133   MCV 85.3 12/29/2019 1133   MCH 27.3 12/29/2019 1133   MCHC 32.0 12/29/2019 1133   RDW 14.0 12/29/2019 1133   LYMPHSABS 0.9 12/29/2019 1133   MONOABS 0.2 12/29/2019 1133   EOSABS 0.0 12/29/2019 1133   BASOSABS 0.0 12/29/2019 1133      Assessment & Plan:   Preventative Health Maintenance:  Encouraged her to get a flu shot in the fall Tetanus today Encouraged her to get her Covid booster. Encouraged her to get a Shingrix vaccine Pap smear UTD per her report Mammogram ordered- she will call to schedule Referral to GI for screening colonoscopy Encouraged her to consume a balanced diet and exercise regimen Advised her to see an eye doctor and dentist annually Will check CBC, CMET, TSH, Lipid, A1C, HIV and Hep C today  Left Wrist Pain:  Xray left wrist today  RTC in 1 year, sooner if needed 12/31/2019, NP This visit occurred during the SARS-CoV-2 public health emergency.  Safety protocols were in place, including screening questions prior to the visit, additional usage of staff PPE, and extensive cleaning of exam room while observing appropriate contact time as indicated for disinfecting solutions.

## 2021-04-02 LAB — COMPLETE METABOLIC PANEL WITH GFR
AG Ratio: 1.1 (calc) (ref 1.0–2.5)
ALT: 8 U/L (ref 6–29)
AST: 13 U/L (ref 10–35)
Albumin: 3.8 g/dL (ref 3.6–5.1)
Alkaline phosphatase (APISO): 96 U/L (ref 37–153)
BUN: 19 mg/dL (ref 7–25)
CO2: 32 mmol/L (ref 20–32)
Calcium: 9.3 mg/dL (ref 8.6–10.4)
Chloride: 103 mmol/L (ref 98–110)
Creat: 0.84 mg/dL (ref 0.50–1.03)
Globulin: 3.4 g/dL (calc) (ref 1.9–3.7)
Glucose, Bld: 85 mg/dL (ref 65–139)
Potassium: 3.6 mmol/L (ref 3.5–5.3)
Sodium: 141 mmol/L (ref 135–146)
Total Bilirubin: 0.3 mg/dL (ref 0.2–1.2)
Total Protein: 7.2 g/dL (ref 6.1–8.1)
eGFR: 84 mL/min/{1.73_m2} (ref >=60)

## 2021-04-02 LAB — CBC
HCT: 40.5 % (ref 35.0–45.0)
Hemoglobin: 12.6 g/dL (ref 11.7–15.5)
MCH: 26.8 pg — ABNORMAL LOW (ref 27.0–33.0)
MCHC: 31.1 g/dL — ABNORMAL LOW (ref 32.0–36.0)
MCV: 86 fL (ref 80.0–100.0)
MPV: 9.1 fL (ref 7.5–12.5)
Platelets: 330 10*3/uL (ref 140–400)
RBC: 4.71 10*6/uL (ref 3.80–5.10)
RDW: 13 % (ref 11.0–15.0)
WBC: 4.9 10*3/uL (ref 3.8–10.8)

## 2021-04-02 LAB — HEMOGLOBIN A1C
Hgb A1c MFr Bld: 5.9 % of total Hgb — ABNORMAL HIGH (ref <5.7)
Mean Plasma Glucose: 123 mg/dL
eAG (mmol/L): 6.8 mmol/L

## 2021-04-02 LAB — HIV ANTIBODY (ROUTINE TESTING W REFLEX): HIV 1&2 Ab, 4th Generation: NONREACTIVE

## 2021-04-02 LAB — HEPATITIS C ANTIBODY
Hepatitis C Ab: NONREACTIVE
SIGNAL TO CUT-OFF: 0.01 (ref <1.00)

## 2021-04-02 LAB — LIPID PANEL
Cholesterol: 213 mg/dL — ABNORMAL HIGH (ref <200)
HDL: 55 mg/dL (ref >=50)
LDL Cholesterol (Calc): 137 mg/dL (calc) — ABNORMAL HIGH
Non-HDL Cholesterol (Calc): 158 mg/dL (calc) — ABNORMAL HIGH (ref <130)
Total CHOL/HDL Ratio: 3.9 (calc) (ref <5.0)
Triglycerides: 103 mg/dL (ref <150)

## 2021-04-02 LAB — TSH: TSH: 2.33 mIU/L

## 2021-04-03 ENCOUNTER — Telehealth: Payer: Self-pay | Admitting: Internal Medicine

## 2021-04-03 NOTE — Telephone Encounter (Signed)
Called and left a message for patient to call back and make appt.  We do nurse visits for this on Wednesday Pm 1/:20 - 4:30 and Friday AM 8:00 - 11:30.

## 2021-04-03 NOTE — Telephone Encounter (Signed)
Pt is calling to schedule shingles vaccine. Pt states it is approved with her insurance. CB- [phone]

## 2021-04-08 ENCOUNTER — Other Ambulatory Visit: Payer: Self-pay

## 2021-04-08 ENCOUNTER — Ambulatory Visit
Admission: RE | Admit: 2021-04-08 | Discharge: 2021-04-08 | Disposition: A | Discharge status: Home / Self Care | Payer: Federal, State, Local not specified - PPO | Source: Ambulatory Visit | Attending: Internal Medicine | Admitting: Internal Medicine

## 2021-04-08 DIAGNOSIS — Z1231 Encounter for screening mammogram for malignant neoplasm of breast: Secondary | ICD-10-CM | POA: Diagnosis not present

## 2021-04-09 ENCOUNTER — Ambulatory Visit (INDEPENDENT_AMBULATORY_CARE_PROVIDER_SITE_OTHER): Payer: Federal, State, Local not specified - PPO | Admitting: Internal Medicine

## 2021-04-09 DIAGNOSIS — Z23 Encounter for immunization: Secondary | ICD-10-CM | POA: Diagnosis not present

## 2021-04-10 ENCOUNTER — Other Ambulatory Visit: Payer: Self-pay | Admitting: Internal Medicine

## 2021-04-11 MED ORDER — HYDROCHLOROTHIAZIDE 25 MG PO TABS: 25.0000 mg | ORAL_TABLET | Freq: Every day | ORAL | 0 refills | Status: DC

## 2021-04-18 IMAGING — CR DG SHOULDER 2+V*L*
3 series · 3 of 3 positions shown · non-contrast
Comparison: None.

CLINICAL DATA: Left shoulder pain x2 weeks

EXAM:
LEFT SHOULDER - 2+ VIEW

[shoulder grashey]
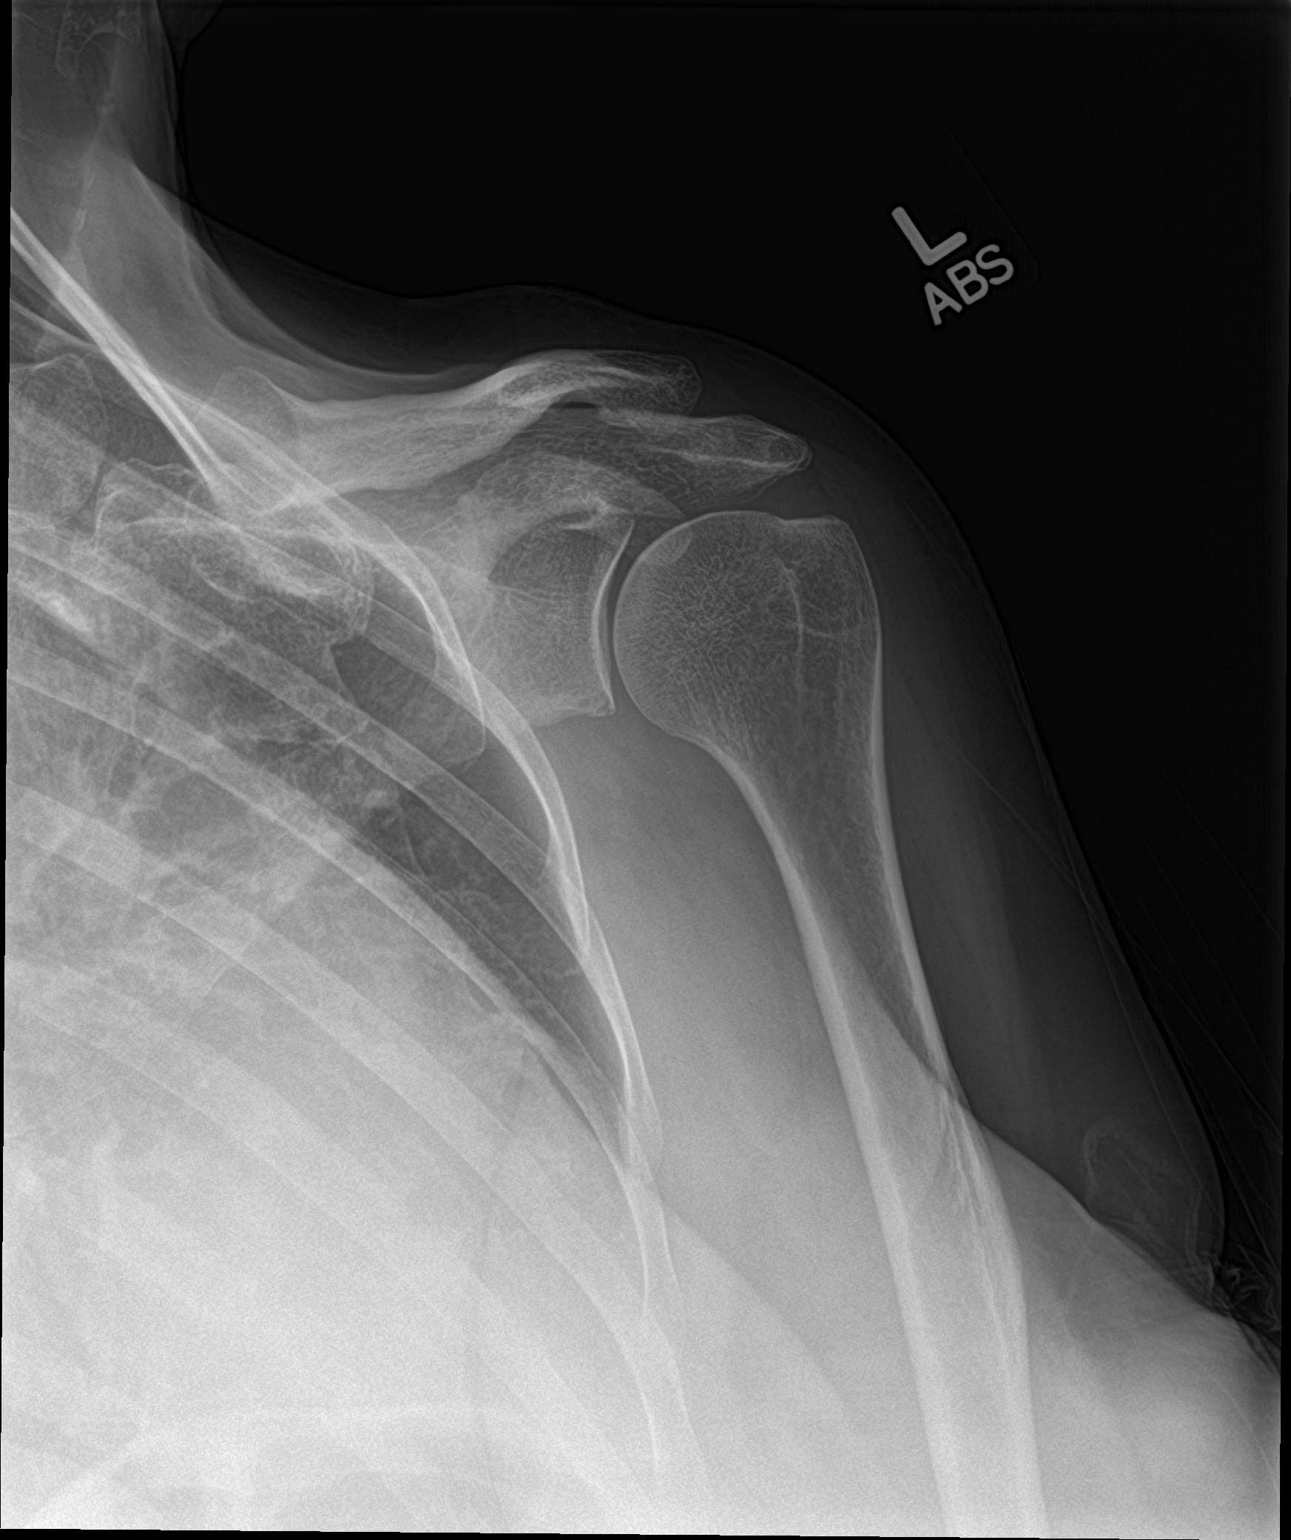

[shoulder y view]
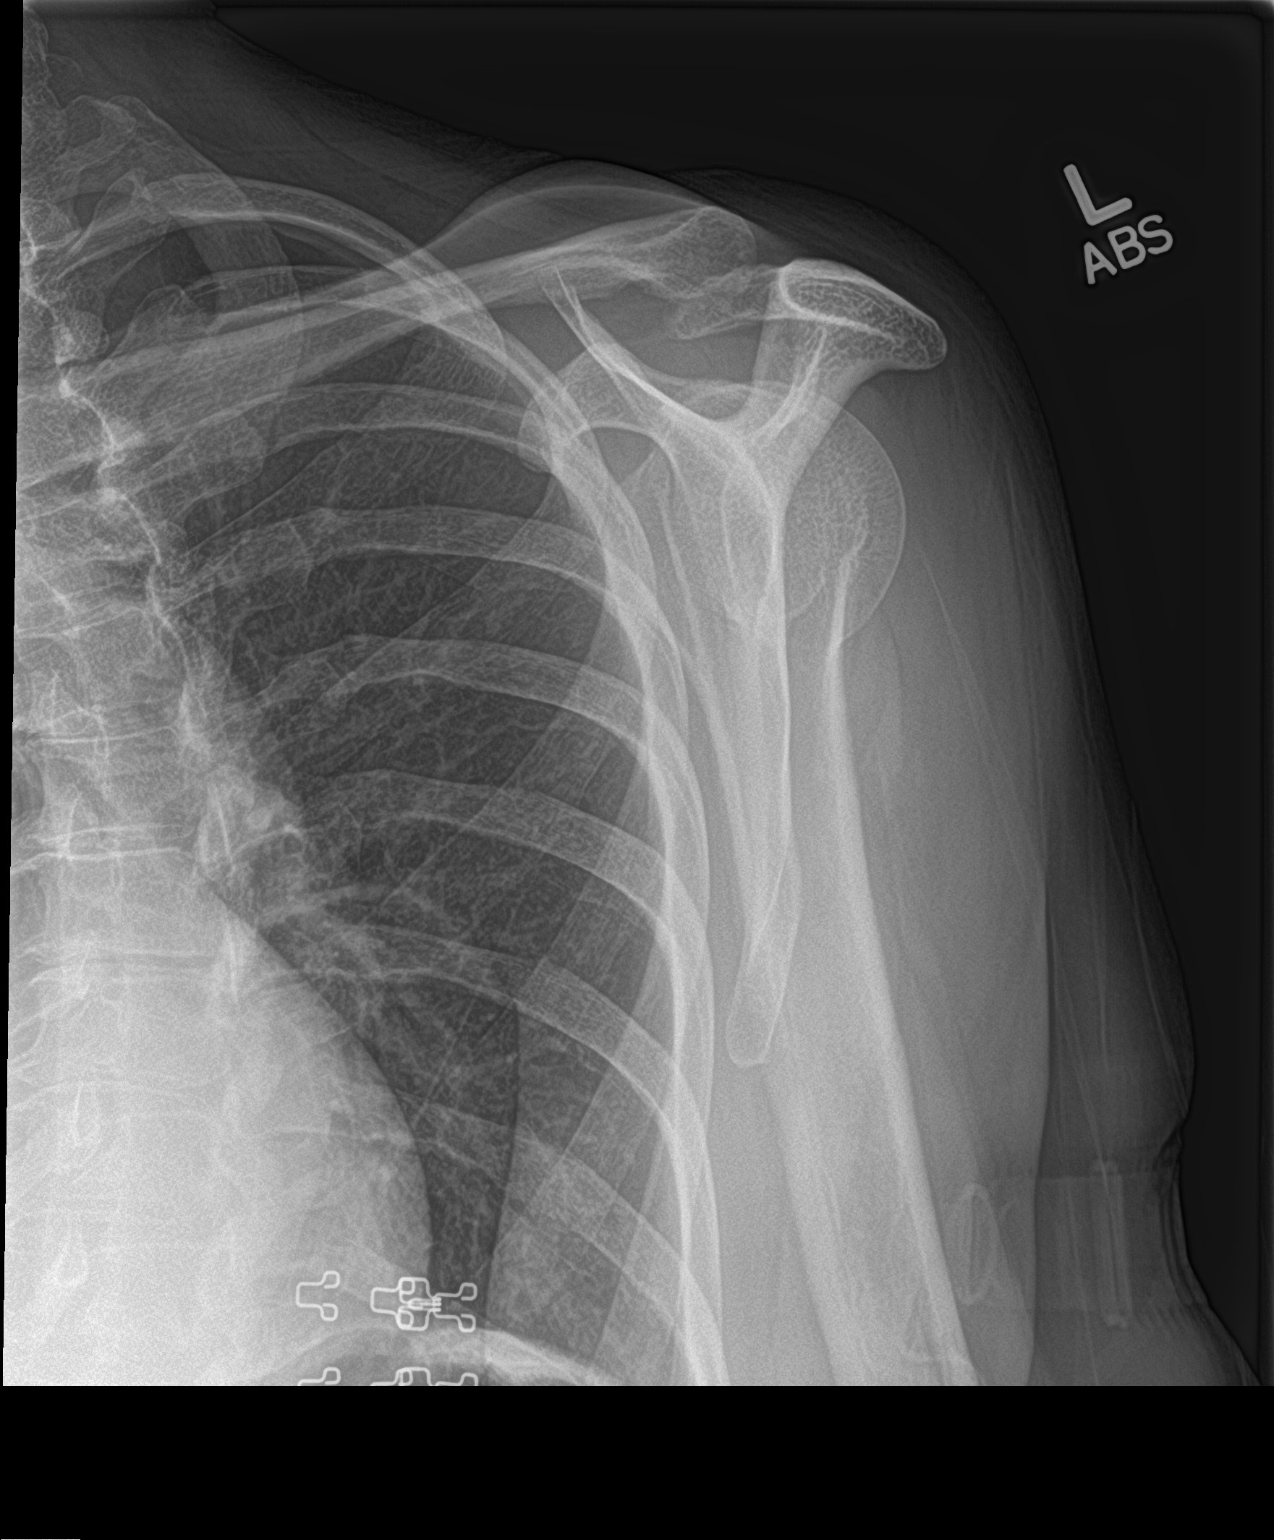

[shoulder axillary]
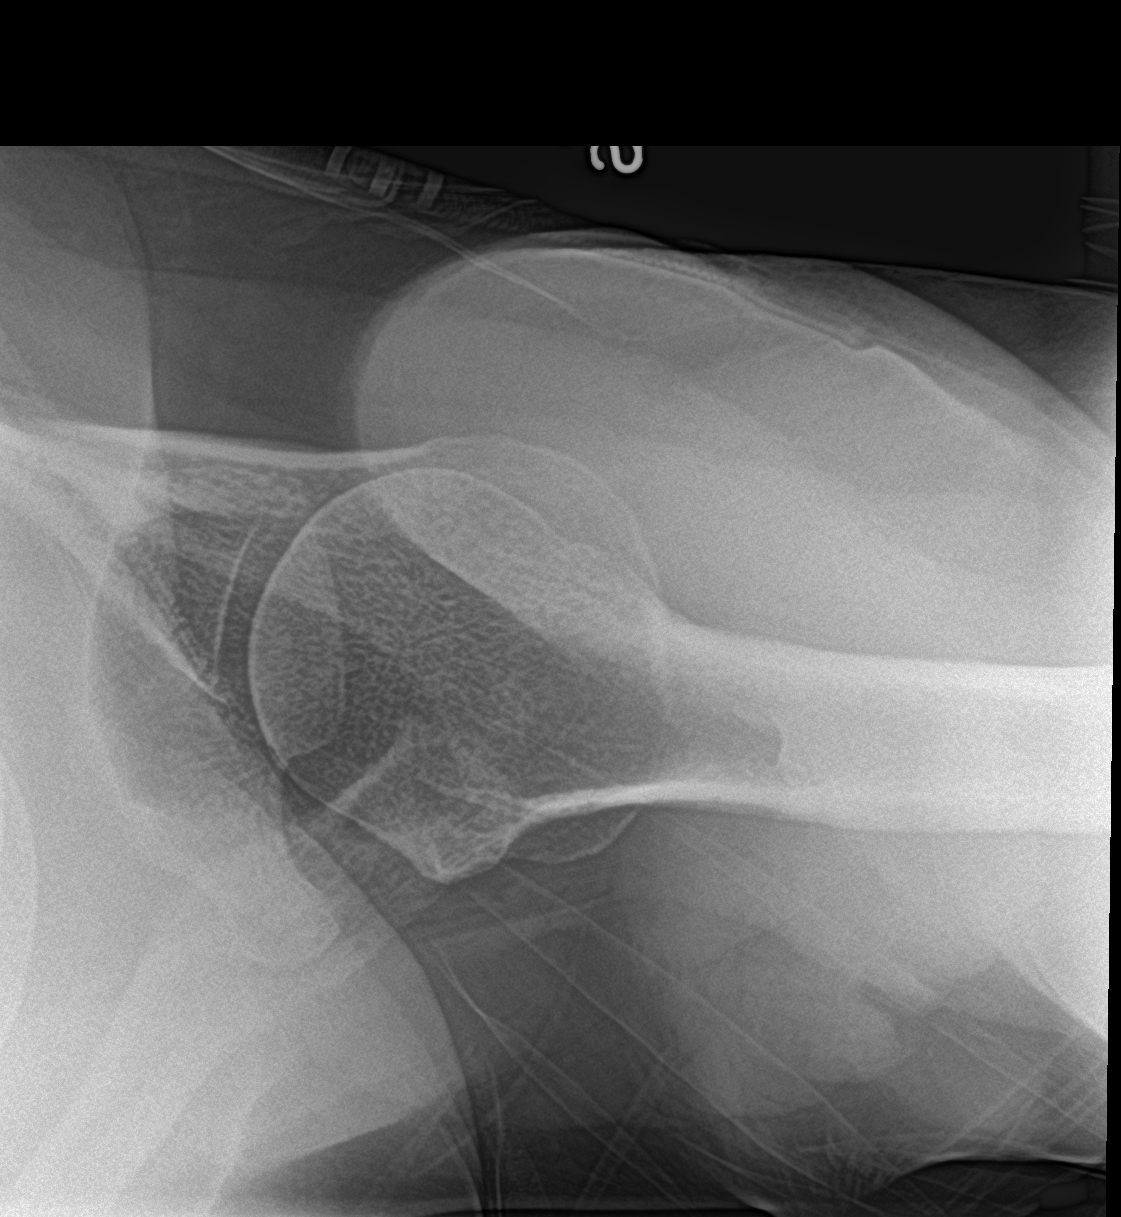

[3 of 3 positions shown; findings below may reference images not displayed]

FINDINGS: No fracture or dislocation is seen.

The joint spaces are preserved.

The visualized soft tissues are unremarkable.
IMPRESSION: Negative.

## 2021-05-09 ENCOUNTER — Other Ambulatory Visit: Payer: Self-pay | Admitting: Internal Medicine

## 2021-05-09 MED ORDER — HYDROCHLOROTHIAZIDE 25 MG PO TABS: 25.0000 mg | ORAL_TABLET | Freq: Every day | ORAL | 0 refills | Status: DC

## 2021-06-11 ENCOUNTER — Other Ambulatory Visit: Payer: Self-pay | Admitting: Internal Medicine

## 2021-06-11 MED ORDER — HYDROCHLOROTHIAZIDE 25 MG PO TABS: 25.0000 mg | ORAL_TABLET | Freq: Every day | ORAL | 6 refills | Status: DC

## 2021-07-01 ENCOUNTER — Ambulatory Visit: Payer: Self-pay | Admitting: Internal Medicine

## 2021-07-15 ENCOUNTER — Encounter: Payer: Self-pay | Admitting: Internal Medicine

## 2021-07-15 ENCOUNTER — Other Ambulatory Visit: Payer: Self-pay

## 2021-07-15 ENCOUNTER — Ambulatory Visit: Payer: Federal, State, Local not specified - PPO | Admitting: Internal Medicine

## 2021-07-15 VITALS — BP 117/67 | HR 74 | Temp 97.7°F | Resp 17 | Ht 67.0 in | Wt 205.4 lb

## 2021-07-15 DIAGNOSIS — I10 Essential (primary) hypertension: Secondary | ICD-10-CM | POA: Diagnosis not present

## 2021-07-15 DIAGNOSIS — R7303 Prediabetes: Secondary | ICD-10-CM | POA: Insufficient documentation

## 2021-07-15 DIAGNOSIS — Z6832 Body mass index (BMI) 32.0-32.9, adult: Secondary | ICD-10-CM

## 2021-07-15 DIAGNOSIS — E7800 Pure hypercholesterolemia, unspecified: Secondary | ICD-10-CM

## 2021-07-15 DIAGNOSIS — E6609 Other obesity due to excess calories: Secondary | ICD-10-CM

## 2021-07-15 DIAGNOSIS — Z1211 Encounter for screening for malignant neoplasm of colon: Secondary | ICD-10-CM

## 2021-07-15 DIAGNOSIS — E785 Hyperlipidemia, unspecified: Secondary | ICD-10-CM | POA: Insufficient documentation

## 2021-07-15 MED ORDER — SIMVASTATIN 10 MG PO TABS: 10.0000 mg | ORAL_TABLET | Freq: Every day | ORAL | 2 refills | Status: DC

## 2021-07-15 NOTE — Assessment & Plan Note (Signed)
RX for Simvastatin 10 mg daily Encouraged her to consume a low saturated fat diet

## 2021-07-15 NOTE — Assessment & Plan Note (Signed)
Controlled on HCTZ Reinforced DASH diet and exercise for weight loss

## 2021-07-15 NOTE — Patient Instructions (Signed)
Heart-Healthy Eating Plan Heart-healthy meal planning includes: Eating less unhealthy fats. Eating more healthy fats. Making other changes in your diet. Talk with your doctor or a diet specialist (dietitian) to create an eating plan that is right for you. What is my plan? Your doctor may recommend an eating plan that includes: Total fat: ______% or less of total calories a day. Saturated fat: ______% or less of total calories a day. Cholesterol: less than _________mg a day. What are tips for following this plan? Cooking Avoid frying your food. Try to bake, boil, grill, or broil it instead. You can also reduce fat by: Removing the skin from poultry. Removing all visible fats from meats. Steaming vegetables in water or broth. Meal planning  At meals, divide your plate into four equal parts: Fill one-half of your plate with vegetables and green salads. Fill one-fourth of your plate with whole grains. Fill one-fourth of your plate with lean protein foods. Eat 4-5 servings of vegetables per day. A serving of vegetables is: 1 cup of raw or cooked vegetables. 2 cups of raw leafy greens. Eat 4-5 servings of fruit per day. A serving of fruit is: 1 medium whole fruit.  cup of dried fruit.  cup of fresh, frozen, or canned fruit.  cup of 100% fruit juice. Eat more foods that have soluble fiber. These are apples, broccoli, carrots, beans, peas, and barley. Try to get 20-30 g of fiber per day. Eat 4-5 servings of nuts, legumes, and seeds per week: 1 serving of dried beans or legumes equals  cup after being cooked. 1 serving of nuts is  cup. 1 serving of seeds equals 1 tablespoon. General information Eat more home-cooked food. Eat less restaurant, buffet, and fast food. Limit or avoid alcohol. Limit foods that are high in starch and sugar. Avoid fried foods. Lose weight if you are overweight. Keep track of how much salt (sodium) you eat. This is important if you have high blood  pressure. Ask your doctor to tell you more about this. Try to add vegetarian meals each week. Fats Choose healthy fats. These include olive oil and canola oil, flaxseeds, walnuts, almonds, and seeds. Eat more omega-3 fats. These include salmon, mackerel, sardines, tuna, flaxseed oil, and ground flaxseeds. Try to eat fish at least 2 times each week. Check food labels. Avoid foods with trans fats or high amounts of saturated fat. Limit saturated fats. These are often found in animal products, such as meats, butter, and cream. These are also found in plant foods, such as palm oil, palm kernel oil, and coconut oil. Avoid foods with partially hydrogenated oils in them. These have trans fats. Examples are stick margarine, some tub margarines, cookies, crackers, and other baked goods. What foods can I eat? Fruits All fresh, canned (in natural juice), or frozen fruits. Vegetables Fresh or frozen vegetables (raw, steamed, roasted, or grilled). Green salads. Grains Most grains. Choose whole wheat and whole grains most of the time. Rice and pasta, including brown rice and pastas made with whole wheat. Meats and other proteins Lean, well-trimmed beef, veal, pork, and lamb. Chicken and turkey without skin. All fish and shellfish. Wild duck, rabbit, pheasant, and venison. Egg whites or low-cholesterol egg substitutes. Dried beans, peas, lentils, and tofu. Seeds and most nuts. Dairy Low-fat or nonfat cheeses, including ricotta and mozzarella. Skim or 1% milk that is liquid, powdered, or evaporated. Buttermilk that is made with low-fat milk. Nonfat or low-fat yogurt. Fats and oils Non-hydrogenated (trans-free) margarines. Vegetable oils, including   soybean, sesame, sunflower, olive, peanut, safflower, corn, canola, and cottonseed. Salad dressings or mayonnaise made with a vegetable oil. Beverages Mineral water. Coffee and tea. Diet carbonated beverages. Sweets and desserts Sherbet, gelatin, and fruit ice.  Small amounts of dark chocolate. Limit all sweets and desserts. Seasonings and condiments All seasonings and condiments. The items listed above may not be a complete list of foods and drinks you can eat. Contact a dietitian for more options. What foods should I avoid? Fruits Canned fruit in heavy syrup. Fruit in cream or butter sauce. Fried fruit. Limit coconut. Vegetables Vegetables cooked in cheese, cream, or butter sauce. Fried vegetables. Grains Breads that are made with saturated or trans fats, oils, or whole milk. Croissants. Sweet rolls. Donuts. High-fat crackers, such as cheese crackers. Meats and other proteins Fatty meats, such as hot dogs, ribs, sausage, bacon, rib-eye roast or steak. High-fat deli meats, such as salami and bologna. Caviar. Domestic duck and goose. Organ meats, such as liver. Dairy Cream, sour cream, cream cheese, and creamed cottage cheese. Whole-milk cheeses. Whole or 2% milk that is liquid, evaporated, or condensed. Whole buttermilk. Cream sauce or high-fat cheese sauce. Yogurt that is made from whole milk. Fats and oils Meat fat, or shortening. Cocoa butter, hydrogenated oils, palm oil, coconut oil, palm kernel oil. Solid fats and shortenings, including bacon fat, salt pork, lard, and butter. Nondairy cream substitutes. Salad dressings with cheese or sour cream. Beverages Regular sodas and juice drinks with added sugar. Sweets and desserts Frosting. Pudding. Cookies. Cakes. Pies. Milk chocolate or white chocolate. Buttered syrups. Full-fat ice cream or ice cream drinks. The items listed above may not be a complete list of foods and drinks to avoid. Contact a dietitian for more information. Summary Heart-healthy meal planning includes eating less unhealthy fats, eating more healthy fats, and making other changes in your diet. Eat a balanced diet. This includes fruits and vegetables, low-fat or nonfat dairy, lean protein, nuts and legumes, whole grains, and  heart-healthy oils and fats. This information is not intended to replace advice given to you by your health care provider. Make sure you discuss any questions you have with your health care provider. Document Revised: 01/02/2021 Document Reviewed: 01/02/2021 Elsevier Patient Education  2022 Elsevier Inc.  

## 2021-07-15 NOTE — Progress Notes (Signed)
Subjective:    Patient ID: Felicia Jackson, female    DOB: 08-Nov-1970, 50 y.o.   MRN: [id number]  HPI  Patient presents to clinic today for 19-month follow-up.  HLD: Her last LDL was 137, triglycerides 103, 03/2021.  She was advised to start Atorvastatin but this medication was never picked up from pharmacy.  She does not consume a low-fat diet.  Prediabetes: Her last A1c was 5.9%, 03/2021.  She is not taking any oral diabetic medication at this time.  She does not check her sugars.  HTN: Her BP today is 117/67.  She is taking HCTZ as prescribed.  There is no ECG on file.  She also never heard anything about her colonoscopy.  Review of Systems     Past Medical History:  Diagnosis Date   Hypertension     Current Outpatient Medications  Medication Sig Dispense Refill   ferrous sulfate 325 (65 FE) MG tablet Take 325 mg by mouth daily with breakfast.     hydrochlorothiazide (HYDRODIURIL) 25 MG tablet Take 1 tablet (25 mg total) by mouth daily. 30 tablet 6   vitamin B-12 (CYANOCOBALAMIN) 1000 MCG tablet Take 1,000 mcg by mouth daily.     No current facility-administered medications for this visit.    No Known Allergies  Family History  Problem Relation Age of Onset   Heart attack Mother    Hypertension Father     Social History   Socioeconomic History   Marital status: Widowed    Spouse name: Not on file   Number of children: Not on file   Years of education: Not on file   Highest education level: Not on file  Occupational History   Not on file  Tobacco Use   Smoking status: Never   Smokeless tobacco: Never  Vaping Use   Vaping Use: Never used  Substance and Sexual Activity   Alcohol use: Not Currently   Drug use: Never   Sexual activity: Not on file  Other Topics Concern   Not on file  Social History Narrative   Not on file   Social Determinants of Health   Financial Resource Strain: Not on file  Food Insecurity: Not on file  Transportation Needs: Not  on file  Physical Activity: Not on file  Stress: Not on file  Social Connections: Not on file  Intimate Partner Violence: Not on file     Constitutional: Denies fever, malaise, fatigue, headache or abrupt weight changes.  Respiratory: Denies difficulty breathing, shortness of breath, cough or sputum production.   Cardiovascular: Denies chest pain, chest tightness, palpitations or swelling in the hands or feet.  Gastrointestinal: Denies abdominal pain, bloating, constipation, diarrhea or blood in the stool.  Neurological: Denies dizziness, difficulty with memory, difficulty with speech or problems with balance and coordination.    No other specific complaints in a complete review of systems (except as listed in HPI above).  Objective:   Physical Exam  BP 117/67 (BP Location: Right Arm, Patient Position: Sitting, Cuff Size: Large)   Pulse 74   Temp 97.7 F (36.5 C) (Temporal)   Resp 17   Ht 5\' 7"  (1.702 m)   Wt 205 lb 6.4 oz (93.2 kg)   LMP 12/30/2011   SpO2 100%   BMI 32.17 kg/m  LMP 12/30/2011  Wt Readings from Last 3 Encounters:  04/01/21 200 lb 12.8 oz (91.1 kg)  03/13/21 200 lb 6.4 oz (90.9 kg)  12/29/19 197 lb 8.5 oz (89.6 kg)  General: Appears her stated age, obese, in NAD. Cardiovascular: Normal rate and rhythm. S1,S2 noted.  No murmur, rubs or gallops noted. No JVD or BLE edema. No carotid bruits noted. Pulmonary/Chest: Normal effort and positive vesicular breath sounds. No respiratory distress. No wheezes, rales or ronchi noted.  Musculoskeletal: No difficulty with gait.  Neurological: Alert and oriented.  BMET    Component Value Date/Time   NA 141 04/01/2021 1523   K 3.6 04/01/2021 1523   CL 103 04/01/2021 1523   CO2 32 04/01/2021 1523   GLUCOSE 85 04/01/2021 1523   BUN 19 04/01/2021 1523   CREATININE 0.84 04/01/2021 1523   CALCIUM 9.3 04/01/2021 1523    Lipid Panel     Component Value Date/Time   CHOL 213 (H) 04/01/2021 1523   TRIG 103  04/01/2021 1523   HDL 55 04/01/2021 1523   CHOLHDL 3.9 04/01/2021 1523   LDLCALC 137 (H) 04/01/2021 1523    CBC    Component Value Date/Time   WBC 4.9 04/01/2021 1523   RBC 4.71 04/01/2021 1523   HGB 12.6 04/01/2021 1523   HCT 40.5 04/01/2021 1523   PLT 330 04/01/2021 1523   MCV 86.0 04/01/2021 1523   MCH 26.8 (L) 04/01/2021 1523   MCHC 31.1 (L) 04/01/2021 1523   RDW 13.0 04/01/2021 1523   LYMPHSABS 0.9 12/29/2019 1133   MONOABS 0.2 12/29/2019 1133   EOSABS 0.0 12/29/2019 1133   BASOSABS 0.0 12/29/2019 1133    Hgb A1C Lab Results  Component Value Date   HGBA1C 5.9 (H) 04/01/2021           Assessment & Plan:    Screen for Colon Cancer:  Referral to GI placed  RTC in 3 months, follow up chronic conditions Nicki Reaper, NP This visit occurred during the SARS-CoV-2 public health emergency.  Safety protocols were in place, including screening questions prior to the visit, additional usage of staff PPE, and extensive cleaning of exam room while observing appropriate contact time as indicated for disinfecting solutions.

## 2021-07-15 NOTE — Assessment & Plan Note (Signed)
Encouraged low carb diet and exercise for weight loss 

## 2021-07-15 NOTE — Assessment & Plan Note (Signed)
Encouraged diet and exercise for weight loss ?

## 2021-07-30 ENCOUNTER — Telehealth: Payer: Self-pay

## 2021-07-30 ENCOUNTER — Other Ambulatory Visit: Payer: Self-pay

## 2021-07-30 MED ORDER — CLENPIQ 10-3.5-12 MG-GM -GM/160ML PO SOLN: 320.0000 mL | ORAL | 0 refills | Status: DC

## 2021-07-30 NOTE — Telephone Encounter (Signed)
Gastroenterology Pre-Procedure Review  Request Date: 09/12/21 Requesting Physician: Dr. Allegra Lai  PATIENT REVIEW QUESTIONS: The patient responded to the following health history questions as indicated:    1. Are you having any GI issues? no 2. Do you have a personal history of Polyps? no 3. Do you have a family history of Colon Cancer or Polyps? no 4. Diabetes Mellitus? no 5. Joint replacements in the past 12 months?no 6. Major health problems in the past 3 months?no 7. Any artificial heart valves, MVP, or defibrillator?no    MEDICATIONS & ALLERGIES:    Patient reports the following regarding taking any anticoagulation/antiplatelet therapy:   Plavix, Coumadin, Eliquis, Xarelto, Lovenox, Pradaxa, Brilinta, or Effient? no Aspirin? no  Patient confirms/reports the following medications:  Current Outpatient Medications  Medication Sig Dispense Refill   ferrous sulfate 325 (65 FE) MG tablet Take 325 mg by mouth daily with breakfast.     hydrochlorothiazide (HYDRODIURIL) 25 MG tablet Take 1 tablet (25 mg total) by mouth daily. 30 tablet 6   simvastatin (ZOCOR) 10 MG tablet Take 1 tablet (10 mg total) by mouth at bedtime. 30 tablet 2   vitamin B-12 (CYANOCOBALAMIN) 1000 MCG tablet Take 1,000 mcg by mouth daily.     No current facility-administered medications for this visit.    Patient confirms/reports the following allergies:  No Known Allergies  No orders of the defined types were placed in this encounter.   AUTHORIZATION INFORMATION Primary Insurance: 1D#: Group #:  Secondary Insurance: 1D#: Group #:  SCHEDULE INFORMATION: Date:  Time: Location:

## 2021-09-10 ENCOUNTER — Other Ambulatory Visit: Payer: Self-pay

## 2021-09-10 MED ORDER — SIMVASTATIN 10 MG PO TABS: 10.0000 mg | ORAL_TABLET | Freq: Every day | ORAL | 2 refills | Status: DC

## 2021-09-12 ENCOUNTER — Encounter: Admission: RE | Payer: Self-pay | Source: Home / Self Care

## 2021-09-12 ENCOUNTER — Ambulatory Visit
Admission: RE | Admit: 2021-09-12 | Payer: Federal, State, Local not specified - PPO | Source: Home / Self Care | Admitting: Gastroenterology

## 2021-09-12 SURGERY — COLONOSCOPY
Anesthesia: General

## 2021-10-16 ENCOUNTER — Encounter: Payer: Self-pay | Admitting: Internal Medicine

## 2021-10-16 ENCOUNTER — Other Ambulatory Visit: Payer: Self-pay

## 2021-10-16 ENCOUNTER — Ambulatory Visit: Payer: Federal, State, Local not specified - PPO | Admitting: Internal Medicine

## 2021-10-16 VITALS — BP 133/75 | HR 71 | Temp 97.3°F | Wt 206.0 lb

## 2021-10-16 DIAGNOSIS — D508 Other iron deficiency anemias: Secondary | ICD-10-CM | POA: Diagnosis not present

## 2021-10-16 DIAGNOSIS — E7800 Pure hypercholesterolemia, unspecified: Secondary | ICD-10-CM

## 2021-10-16 DIAGNOSIS — E6609 Other obesity due to excess calories: Secondary | ICD-10-CM

## 2021-10-16 DIAGNOSIS — Z6832 Body mass index (BMI) 32.0-32.9, adult: Secondary | ICD-10-CM

## 2021-10-16 DIAGNOSIS — I10 Essential (primary) hypertension: Secondary | ICD-10-CM

## 2021-10-16 DIAGNOSIS — R7303 Prediabetes: Secondary | ICD-10-CM

## 2021-10-16 DIAGNOSIS — M25641 Stiffness of right hand, not elsewhere classified: Secondary | ICD-10-CM

## 2021-10-16 LAB — POCT GLYCOSYLATED HEMOGLOBIN (HGB A1C): Hemoglobin A1C: 5.9 % — AB (ref 4.0–5.6)

## 2021-10-16 NOTE — Assessment & Plan Note (Signed)
Encourage diet and exercise for weight loss 

## 2021-10-16 NOTE — Assessment & Plan Note (Signed)
A1c POCT 5.9% Encourage low-carb diet and exercise for weight loss

## 2021-10-16 NOTE — Assessment & Plan Note (Signed)
C-Met and lipid profile today Encouraged her to consume a low-fat diet Continue Simvastatin 

## 2021-10-16 NOTE — Progress Notes (Signed)
Subjective:    Patient ID: Felicia Jackson, female    DOB: [birth year], 51 y.o.   MRN: [id number]  HPI  Patient presents to clinic today for 75-month follow-up of chronic conditions.  HTN: Her BP today is 133/75.  She is taking HCTZ as prescribed.  There is no ECG on file.  Prediabetes: Her last A1c was 5.9%, 03/2021.  She is not taking any oral diabetic medication at this time.  She does not check her sugars.  HLD: Her last LDL was 137, triglycerides 103, 03/2021.  She denies myalgias on Simvastatin.  She does not consume a low-fat diet.  Iron Deficiency Anemia: Her last H/H was 12.6/40.5, 03/2021.  She is taking oral Iron as prescribed.  She does not follow with hematology.  She also reports stiffness of her right thumb. She noticed this a few months ago. She denies pain but does report decreased range of motion. She has not had any injury to the right thumb.  Review of Systems     Past Medical History:  Diagnosis Date   Hypertension     Current Outpatient Medications  Medication Sig Dispense Refill   ferrous sulfate 325 (65 FE) MG tablet Take 325 mg by mouth daily with breakfast.     hydrochlorothiazide (HYDRODIURIL) 25 MG tablet Take 1 tablet (25 mg total) by mouth daily. 30 tablet 6   simvastatin (ZOCOR) 10 MG tablet Take 1 tablet (10 mg total) by mouth at bedtime. 30 tablet 2   Sod Picosulfate-Mag Ox-Cit Acd (CLENPIQ) 10-3.5-12 MG-GM -GM/160ML SOLN Take 320 mLs by mouth as directed. 320 mL 0   vitamin B-12 (CYANOCOBALAMIN) 1000 MCG tablet Take 1,000 mcg by mouth daily.     No current facility-administered medications for this visit.    No Known Allergies  Family History  Problem Relation Age of Onset   Heart attack Mother    Hypertension Father     Social History   Socioeconomic History   Marital status: Widowed    Spouse name: Not on file   Number of children: Not on file   Years of education: Not on file   Highest education level: Not on file  Occupational  History   Not on file  Tobacco Use   Smoking status: Never   Smokeless tobacco: Never  Vaping Use   Vaping Use: Never used  Substance and Sexual Activity   Alcohol use: Not Currently   Drug use: Never   Sexual activity: Not on file  Other Topics Concern   Not on file  Social History Narrative   Not on file   Social Determinants of Health   Financial Resource Strain: Not on file  Food Insecurity: Not on file  Transportation Needs: Not on file  Physical Activity: Not on file  Stress: Not on file  Social Connections: Not on file  Intimate Partner Violence: Not on file     Constitutional: Denies fever, malaise, fatigue, headache or abrupt weight changes.  Respiratory: Denies difficulty breathing, shortness of breath, cough or sputum production.   Cardiovascular: Denies chest pain, chest tightness, palpitations or swelling in the hands or feet.  Gastrointestinal: Denies abdominal pain, bloating, constipation, diarrhea or blood in the stool.  GU: Denies urgency, frequency, pain with urination, burning sensation, blood in urine, odor or discharge. Musculoskeletal: Denies decrease in range of motion, difficulty with gait, muscle pain or joint pain and swelling.  Skin: Denies redness, rashes, lesions or ulcercations.  Neurological: Pt reports right thumb stiffness. Denies dizziness,  difficulty with memory, difficulty with speech or problems with balance and coordination.  Psych: Denies anxiety, depression, SI/HI.  No other specific complaints in a complete review of systems (except as listed in HPI above).  Objective:   Physical Exam  BP 133/75 (BP Location: Right Arm, Patient Position: Sitting, Cuff Size: Large)    Pulse 71    Temp (!) 97.3 F (36.3 C) (Temporal)    Wt 206 lb (93.4 kg)    LMP 12/30/2011    SpO2 99%    BMI 32.26 kg/m   Wt Readings from Last 3 Encounters:  07/15/21 205 lb 6.4 oz (93.2 kg)  04/01/21 200 lb 12.8 oz (91.1 kg)  03/13/21 200 lb 6.4 oz (90.9 kg)     General: Appears her  stated age, obese, in NAD. Skin: Warm, dry and intact.  HEENT: Head: normal shape and size; Eyes: EOMs intact;  Neck:  Neck supple, trachea midline. No masses, lumps or thyromegaly present.  Cardiovascular: Normal rate and rhythm. S1,S2 noted.  No murmur, rubs or gallops noted. No JVD or BLE edema. No carotid bruits noted. Pulmonary/Chest: Normal effort and positive vesicular breath sounds. No respiratory distress. No wheezes, rales or ronchi noted.  Musculoskeletal: Decreased flexion and of the right thumb. Click noted with extension of the right thumb. No pain with palpation of the right thumb.  Neurological: Alert and oriented.   BMET    Component Value Date/Time   NA 141 04/01/2021 1523   K 3.6 04/01/2021 1523   CL 103 04/01/2021 1523   CO2 32 04/01/2021 1523   GLUCOSE 85 04/01/2021 1523   BUN 19 04/01/2021 1523   CREATININE 0.84 04/01/2021 1523   CALCIUM 9.3 04/01/2021 1523    Lipid Panel     Component Value Date/Time   CHOL 213 (H) 04/01/2021 1523   TRIG 103 04/01/2021 1523   HDL 55 04/01/2021 1523   CHOLHDL 3.9 04/01/2021 1523   LDLCALC 137 (H) 04/01/2021 1523    CBC    Component Value Date/Time   WBC 4.9 04/01/2021 1523   RBC 4.71 04/01/2021 1523   HGB 12.6 04/01/2021 1523   HCT 40.5 04/01/2021 1523   PLT 330 04/01/2021 1523   MCV 86.0 04/01/2021 1523   MCH 26.8 (L) 04/01/2021 1523   MCHC 31.1 (L) 04/01/2021 1523   RDW 13.0 04/01/2021 1523   LYMPHSABS 0.9 12/29/2019 1133   MONOABS 0.2 12/29/2019 1133   EOSABS 0.0 12/29/2019 1133   BASOSABS 0.0 12/29/2019 1133    Hgb A1C Lab Results  Component Value Date   HGBA1C 5.9 (H) 04/01/2021           Assessment & Plan:   Stiffness of Right Thumb:  Likely OA Can take Ibuprofen or Aleve OTC as needed If worsens would consider x-ray  RTC in 6 months for your annual exam  Nicki Reaper, NP This visit occurred during the SARS-CoV-2 public health emergency.  Safety protocols  were in place, including screening questions prior to the visit, additional usage of staff PPE, and extensive cleaning of exam room while observing appropriate contact time as indicated for disinfecting solutions.

## 2021-10-16 NOTE — Assessment & Plan Note (Signed)
CBC today Continue oral iron 

## 2021-10-16 NOTE — Patient Instructions (Signed)
Arthritis Arthritis means joint pain. It can also mean joint disease. A joint is a place where bones come together. There are more than 100 types of arthritis. What are the causes? This condition may be caused by: Wear and tear of a joint. This is the most common cause. A lot of acid in the blood, which leads to pain in the joint (gout). Pain and swelling (inflammation) in a joint. Infection of a joint. Injuries in the joint. A reaction to medicines (allergy). In some cases, the cause may not be known. What are the signs or symptoms? Symptoms of this condition include: Redness at a joint. Swelling at a joint. Stiffness at a joint. Warmth coming from the joint. A fever. A feeling of being sick. How is this treated? This condition may be treated with: Treating the cause, if it is known. Rest. Raising (elevating) the joint. Putting cold or hot packs on the joint. Medicines to treat symptoms and reduce pain and swelling. Shots of medicines (cortisone) into the joint. You may also be told to make changes in your life, such as doing exercises and losing weight. Follow these instructions at home: Medicines Take over-the-counter and prescription medicines only as told by your doctor. Do not take aspirin for pain if your doctor says that you may have gout. Activity Rest your joint if your doctor tells you to. Avoid activities that make the pain worse. Exercise your joint regularly as told by your doctor. Try doing exercises like: Swimming. Water aerobics. Biking. Walking. Managing pain, stiffness, and swelling   If told, put ice on the affected area. Put ice in a plastic bag. Place a towel between your skin and the bag. Leave the ice on for 20 minutes, 2-3 times per day. If your joint is swollen, raise (elevate) it above the level of your heart if told by your doctor. If your joint feels stiff in the morning, try taking a warm shower. If told, put heat on the affected area. Do  this as often as told by your doctor. Use the heat source that your doctor recommends, such as a moist heat pack or a heating pad. If you have diabetes, do not apply heat without asking your doctor. To apply heat: Place a towel between your skin and the heat source. Leave the heat on for 20-30 minutes. Remove the heat if your skin turns bright red. This is very important if you are unable to feel pain, heat, or cold. You may have a greater risk of getting burned. General instructions Do not use any products that contain nicotine or tobacco, such as cigarettes, e-cigarettes, and chewing tobacco. If you need help quitting, ask your doctor. Keep all follow-up visits as told by your doctor. This is important. Contact a doctor if: The pain gets worse. You have a fever. Get help right away if: You have very bad pain in your joint. You have swelling in your joint. Your joint is red. Many joints become painful and swollen. You have very bad back pain. Your leg is very weak. You cannot control your pee (urine) or poop (stool). Summary Arthritis means joint pain. It can also mean joint disease. A joint is a place where bones come together. The most common cause of this condition is wear and tear of a joint. Symptoms of this condition include redness, swelling, or stiffness of the joint. This condition is treated with rest, raising the joint, medicines, and putting cold or hot packs on the joint. Follow your   doctor's instructions about medicines, activity, exercises, and other home care treatments. This information is not intended to replace advice given to you by your health care provider. Make sure you discuss any questions you have with your health care provider. Document Revised: 08/01/2018 Document Reviewed: 08/01/2018 Elsevier Patient Education  2022 [employer]  

## 2021-10-16 NOTE — Assessment & Plan Note (Signed)
Controlled on HCTZ Reinforced DASH diet and exercise for weight loss C-Met today 

## 2021-10-17 LAB — CBC
HCT: 38.6 % (ref 35.0–45.0)
Hemoglobin: 12.4 g/dL (ref 11.7–15.5)
MCH: 27 pg (ref 27.0–33.0)
MCHC: 32.1 g/dL (ref 32.0–36.0)
MCV: 83.9 fL (ref 80.0–100.0)
MPV: 9 fL (ref 7.5–12.5)
Platelets: 301 10*3/uL (ref 140–400)
RBC: 4.6 10*6/uL (ref 3.80–5.10)
RDW: 12.4 % (ref 11.0–15.0)
WBC: 5.2 10*3/uL (ref 3.8–10.8)

## 2021-10-17 LAB — LIPID PANEL
Cholesterol: 163 mg/dL (ref <200)
HDL: 54 mg/dL (ref >=50)
LDL Cholesterol (Calc): 93 mg/dL (calc)
Non-HDL Cholesterol (Calc): 109 mg/dL (calc) (ref <130)
Total CHOL/HDL Ratio: 3 (calc) (ref <5.0)
Triglycerides: 75 mg/dL (ref <150)

## 2021-10-17 LAB — COMPLETE METABOLIC PANEL WITH GFR
AG Ratio: 1 (calc) (ref 1.0–2.5)
ALT: 13 U/L (ref 6–29)
AST: 16 U/L (ref 10–35)
Albumin: 3.6 g/dL (ref 3.6–5.1)
Alkaline phosphatase (APISO): 80 U/L (ref 37–153)
BUN: 15 mg/dL (ref 7–25)
CO2: 33 mmol/L — ABNORMAL HIGH (ref 20–32)
Calcium: 9.2 mg/dL (ref 8.6–10.4)
Chloride: 103 mmol/L (ref 98–110)
Creat: 0.83 mg/dL (ref 0.50–1.03)
Globulin: 3.6 g/dL (calc) (ref 1.9–3.7)
Glucose, Bld: 70 mg/dL (ref 65–139)
Potassium: 3.5 mmol/L (ref 3.5–5.3)
Sodium: 141 mmol/L (ref 135–146)
Total Bilirubin: 0.4 mg/dL (ref 0.2–1.2)
Total Protein: 7.2 g/dL (ref 6.1–8.1)
eGFR: 85 mL/min/{1.73_m2} (ref >=60)

## 2022-04-16 ENCOUNTER — Encounter: Payer: Federal, State, Local not specified - PPO | Admitting: Internal Medicine

## 2022-05-14 ENCOUNTER — Ambulatory Visit (INDEPENDENT_AMBULATORY_CARE_PROVIDER_SITE_OTHER): Payer: Federal, State, Local not specified - PPO | Admitting: Internal Medicine

## 2022-05-14 ENCOUNTER — Encounter: Payer: Self-pay | Admitting: Internal Medicine

## 2022-05-14 ENCOUNTER — Other Ambulatory Visit (HOSPITAL_COMMUNITY)
Admission: RE | Admit: 2022-05-14 | Discharge: 2022-05-14 | Disposition: A | Discharge status: Home / Self Care | Payer: Federal, State, Local not specified - PPO | Source: Ambulatory Visit | Attending: Internal Medicine | Admitting: Internal Medicine

## 2022-05-14 VITALS — BP 136/84 | HR 78 | Temp 96.8°F | Ht 67.0 in | Wt 211.0 lb

## 2022-05-14 DIAGNOSIS — Z124 Encounter for screening for malignant neoplasm of cervix: Secondary | ICD-10-CM | POA: Diagnosis not present

## 2022-05-14 DIAGNOSIS — Z23 Encounter for immunization: Secondary | ICD-10-CM

## 2022-05-14 DIAGNOSIS — Z0001 Encounter for general adult medical examination with abnormal findings: Secondary | ICD-10-CM

## 2022-05-14 DIAGNOSIS — E6609 Other obesity due to excess calories: Secondary | ICD-10-CM

## 2022-05-14 DIAGNOSIS — Z1231 Encounter for screening mammogram for malignant neoplasm of breast: Secondary | ICD-10-CM | POA: Diagnosis not present

## 2022-05-14 DIAGNOSIS — R87610 Atypical squamous cells of undetermined significance on cytologic smear of cervix (ASC-US): Secondary | ICD-10-CM | POA: Diagnosis not present

## 2022-05-14 DIAGNOSIS — Z1211 Encounter for screening for malignant neoplasm of colon: Secondary | ICD-10-CM | POA: Diagnosis not present

## 2022-05-14 DIAGNOSIS — R7303 Prediabetes: Secondary | ICD-10-CM

## 2022-05-14 DIAGNOSIS — Z6833 Body mass index (BMI) 33.0-33.9, adult: Secondary | ICD-10-CM

## 2022-05-14 MED ORDER — SIMVASTATIN 10 MG PO TABS: 10.0000 mg | ORAL_TABLET | Freq: Every day | ORAL | 1 refills | Status: DC

## 2022-05-14 MED ORDER — HYDROCHLOROTHIAZIDE 25 MG PO TABS: 25.0000 mg | ORAL_TABLET | Freq: Every day | ORAL | 1 refills | Status: DC

## 2022-05-14 NOTE — Addendum Note (Signed)
Addended by: Kavin Leech E on: 05/14/2022 05:09 PM   Modules accepted: Orders

## 2022-05-14 NOTE — Patient Instructions (Signed)

## 2022-05-14 NOTE — Assessment & Plan Note (Signed)
Encourage diet and exercise for weight loss 

## 2022-05-14 NOTE — Progress Notes (Signed)
Subjective:    Patient ID: Felicia Jackson, female    DOB: 1971-03-14, 51 y.o.   MRN: [id number]  HPI  Patient presents to clinic today for her annual exam.  Flu: never Tetanus: 03/2021 COVID: never Shingrix: 04/2021 Pap smear: > 5 year ago Mammogram: 04/2021 Colon screening: never Vision screening: annually Dentist: as needed  Diet: She does eat some meat. She consumes fruits and veggies. She tries to avoid fried foods. She drinks mostly water, gatorade. Exercise: None   Review of Systems   Past Medical History:  Diagnosis Date   Hypertension     Current Outpatient Medications  Medication Sig Dispense Refill   ferrous sulfate 325 (65 FE) MG tablet Take 325 mg by mouth daily with breakfast.     hydrochlorothiazide (HYDRODIURIL) 25 MG tablet Take 1 tablet (25 mg total) by mouth daily. 30 tablet 6   simvastatin (ZOCOR) 10 MG tablet Take 1 tablet (10 mg total) by mouth at bedtime. 30 tablet 2   vitamin B-12 (CYANOCOBALAMIN) 1000 MCG tablet Take 1,000 mcg by mouth daily.     No current facility-administered medications for this visit.    No Known Allergies  Family History  Problem Relation Age of Onset   Heart attack Mother    Hypertension Father     Social History   Socioeconomic History   Marital status: Widowed    Spouse name: Not on file   Number of children: Not on file   Years of education: Not on file   Highest education level: Not on file  Occupational History   Not on file  Tobacco Use   Smoking status: Never   Smokeless tobacco: Never  Vaping Use   Vaping Use: Never used  Substance and Sexual Activity   Alcohol use: Not Currently   Drug use: Never   Sexual activity: Not on file  Other Topics Concern   Not on file  Social History Narrative   Not on file   Social Determinants of Health   Financial Resource Strain: Not on file  Food Insecurity: Not on file  Transportation Needs: Not on file  Physical Activity: Not on file  Stress: Not on  file  Social Connections: Not on file  Intimate Partner Violence: Not on file     Constitutional: Denies fever, malaise, fatigue, headache or abrupt weight changes.  HEENT: Denies eye pain, eye redness, ear pain, ringing in the ears, wax buildup, runny nose, nasal congestion, bloody nose, or sore throat. Respiratory: Denies difficulty breathing, shortness of breath, cough or sputum production.   Cardiovascular: Denies chest pain, chest tightness, palpitations or swelling in the hands or feet.  Gastrointestinal: Denies abdominal pain, bloating, constipation, diarrhea or blood in the stool.  GU: Denies urgency, frequency, pain with urination, burning sensation, blood in urine, odor or discharge. Musculoskeletal: Patient reports intermittent knee pain.  Denies decrease in range of motion, difficulty with gait, muscle pain or joint swelling.  Skin: Denies redness, rashes, lesions or ulcercations.  Neurological: Denies dizziness, difficulty with memory, difficulty with speech or problems with balance and coordination.  Psych: Denies anxiety, depression, SI/HI.  No other specific complaints in a complete review of systems (except as listed in HPI above).  Objective:   Physical Exam  BP 136/84 (BP Location: Left Arm, Patient Position: Sitting, Cuff Size: Large)   Pulse 78   Temp (!) 96.8 F (36 C) (Temporal)   Ht '5\' 7"'  (1.702 m)   Wt 211 lb (95.7 kg)   LMP  12/30/2011   SpO2 98%   BMI 33.05 kg/m   Wt Readings from Last 3 Encounters:  10/16/21 206 lb (93.4 kg)  07/15/21 205 lb 6.4 oz (93.2 kg)  04/01/21 200 lb 12.8 oz (91.1 kg)    General: Appears her stated age, obese, in NAD. Skin: Warm, dry and intact.  HEENT: Head: normal shape and size; Eyes: sclera white, no icterus, conjunctiva pink, PERRLA and EOMs intact;  Neck:  Neck supple, trachea midline. No masses, lumps or thyromegaly present.  Cardiovascular: Normal rate and rhythm. S1,S2 noted.  No murmur, rubs or gallops noted. No  JVD or BLE edema. No carotid bruits noted. Pulmonary/Chest: Normal effort and positive vesicular breath sounds. No respiratory distress. No wheezes, rales or ronchi noted.  Abdomen: Soft and nontender. Normal bowel sounds. No distention or masses noted. Liver, spleen and kidneys non palpable. Pelvic: Cystocele noted.  Normal female anatomy.  Cervix without mass or lesion.  No CMT.  Adnexa nonpalpable. Musculoskeletal: Slight peripatellar effusion noted of the left knee.  Strength 5/5 BUE/BLE.  No difficulty with gait.  Neurological: Alert and oriented. Cranial nerves II-XII grossly intact. Coordination normal.  Psychiatric: Mood and affect normal. Behavior is normal. Judgment and thought content normal.    BMET    Component Value Date/Time   NA 141 10/16/2021 1510   K 3.5 10/16/2021 1510   CL 103 10/16/2021 1510   CO2 33 (H) 10/16/2021 1510   GLUCOSE 70 10/16/2021 1510   BUN 15 10/16/2021 1510   CREATININE 0.83 10/16/2021 1510   CALCIUM 9.2 10/16/2021 1510    Lipid Panel     Component Value Date/Time   CHOL 163 10/16/2021 1510   TRIG 75 10/16/2021 1510   HDL 54 10/16/2021 1510   CHOLHDL 3.0 10/16/2021 1510   LDLCALC 93 10/16/2021 1510    CBC    Component Value Date/Time   WBC 5.2 10/16/2021 1510   RBC 4.60 10/16/2021 1510   HGB 12.4 10/16/2021 1510   HCT 38.6 10/16/2021 1510   PLT 301 10/16/2021 1510   MCV 83.9 10/16/2021 1510   MCH 27.0 10/16/2021 1510   MCHC 32.1 10/16/2021 1510   RDW 12.4 10/16/2021 1510   LYMPHSABS 0.9 12/29/2019 1133   MONOABS 0.2 12/29/2019 1133   EOSABS 0.0 12/29/2019 1133   BASOSABS 0.0 12/29/2019 1133    Hgb A1C Lab Results  Component Value Date   HGBA1C 5.9 (A) 10/16/2021           Assessment & Plan:   Preventative Health Maintenance:  She declines flu shot today Tetanus UTD Encouraged her to get her COVID-vaccine Encouraged her to get her second Shingrix vaccine Pap smear today-she declines STD screening Mammogram  UTD Referral to GI for colon screening Encouraged her to consume a balanced diet and exercise regimen Advised her to see an eye doctor and dentist annually We will check CBC, c-Met, lipid, A1c  RTC in 6 months, follow-up chronic conditions Webb Silversmith, NP

## 2022-05-15 LAB — HEMOGLOBIN A1C
Hgb A1c MFr Bld: 5.6 % of total Hgb (ref <5.7)
Mean Plasma Glucose: 114 mg/dL
eAG (mmol/L): 6.3 mmol/L

## 2022-05-15 LAB — CBC
HCT: 41.1 % (ref 35.0–45.0)
Hemoglobin: 13.3 g/dL (ref 11.7–15.5)
MCH: 27.3 pg (ref 27.0–33.0)
MCHC: 32.4 g/dL (ref 32.0–36.0)
MCV: 84.2 fL (ref 80.0–100.0)
MPV: 9.1 fL (ref 7.5–12.5)
Platelets: 308 10*3/uL (ref 140–400)
RBC: 4.88 10*6/uL (ref 3.80–5.10)
RDW: 12.6 % (ref 11.0–15.0)
WBC: 4.6 10*3/uL (ref 3.8–10.8)

## 2022-05-15 LAB — COMPLETE METABOLIC PANEL WITH GFR
AG Ratio: 1 (calc) (ref 1.0–2.5)
ALT: 13 U/L (ref 6–29)
AST: 14 U/L (ref 10–35)
Albumin: 3.9 g/dL (ref 3.6–5.1)
Alkaline phosphatase (APISO): 87 U/L (ref 37–153)
BUN: 13 mg/dL (ref 7–25)
CO2: 32 mmol/L (ref 20–32)
Calcium: 9.2 mg/dL (ref 8.6–10.4)
Chloride: 104 mmol/L (ref 98–110)
Creat: 0.85 mg/dL (ref 0.50–1.03)
Globulin: 3.8 g/dL (calc) — ABNORMAL HIGH (ref 1.9–3.7)
Glucose, Bld: 79 mg/dL (ref 65–99)
Potassium: 3.8 mmol/L (ref 3.5–5.3)
Sodium: 141 mmol/L (ref 135–146)
Total Bilirubin: 0.3 mg/dL (ref 0.2–1.2)
Total Protein: 7.7 g/dL (ref 6.1–8.1)
eGFR: 82 mL/min/{1.73_m2} (ref >=60)

## 2022-05-15 LAB — LIPID PANEL
Cholesterol: 195 mg/dL (ref <200)
HDL: 54 mg/dL (ref >=50)
LDL Cholesterol (Calc): 123 mg/dL (calc) — ABNORMAL HIGH
Non-HDL Cholesterol (Calc): 141 mg/dL (calc) — ABNORMAL HIGH (ref <130)
Total CHOL/HDL Ratio: 3.6 (calc) (ref <5.0)
Triglycerides: 83 mg/dL (ref <150)

## 2022-05-25 LAB — CYTOLOGY - PAP
Comment: NEGATIVE
Comment: NEGATIVE
Comment: NEGATIVE
Diagnosis: UNDETERMINED — AB
HPV 16: NEGATIVE
HPV 18 / 45: NEGATIVE
High risk HPV: POSITIVE — AB

## 2022-07-23 IMAGING — DX DG WRIST COMPLETE 3+V*L*
4 series · 4 of 4 positions shown · non-contrast
Comparison: None.

CLINICAL DATA: left wrist pain x 1  month

EXAM:
LEFT WRIST - COMPLETE 3+ VIEW

[wrist ap]
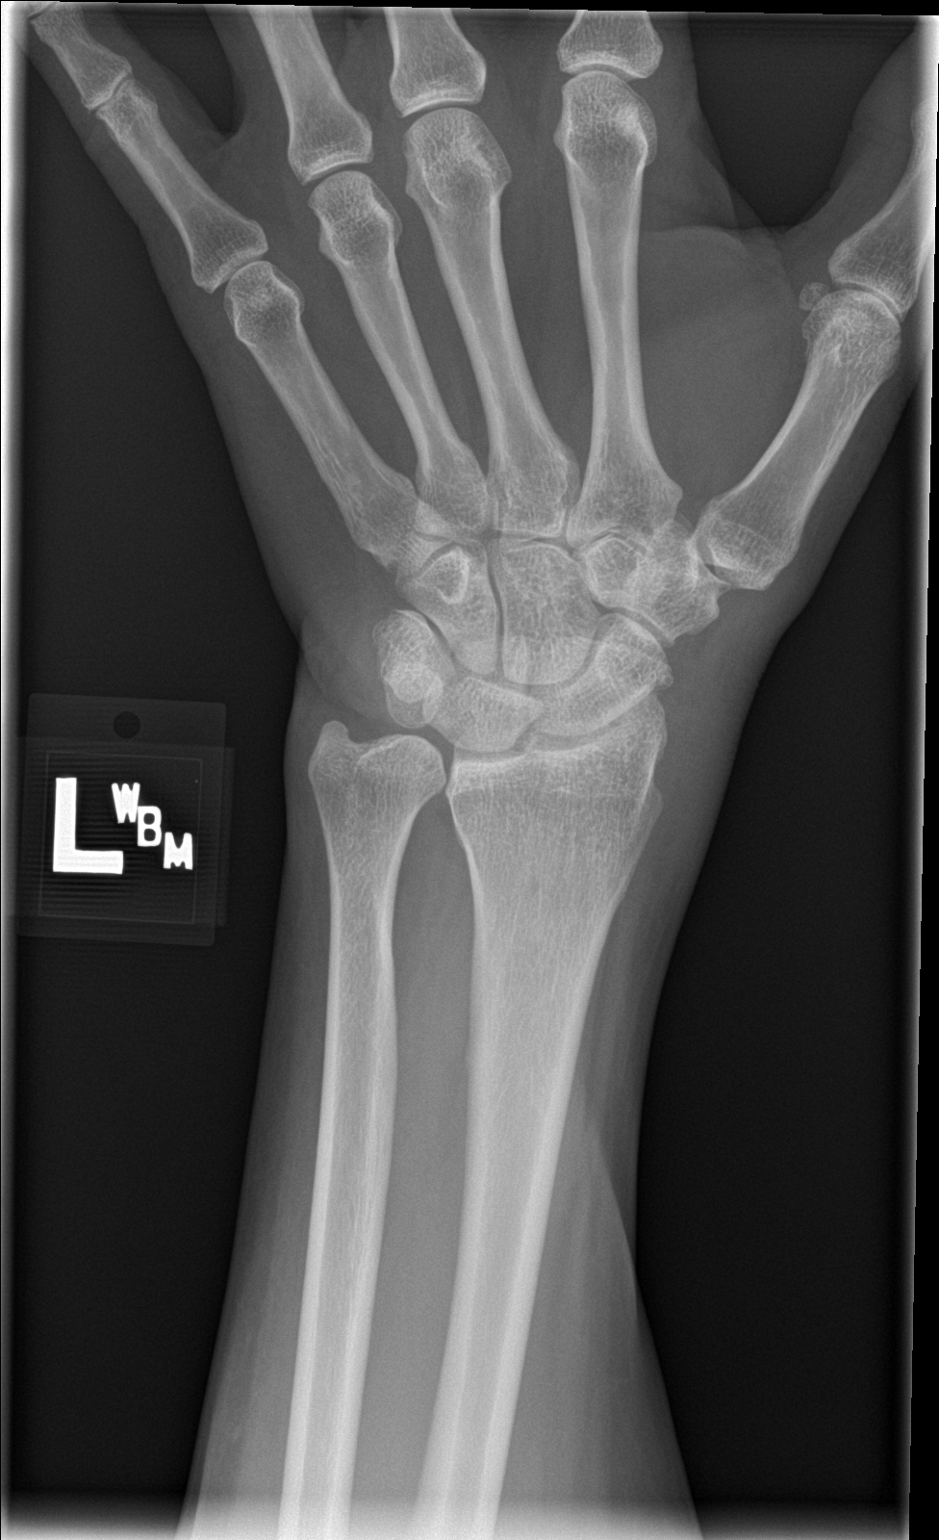

[wrist lat]
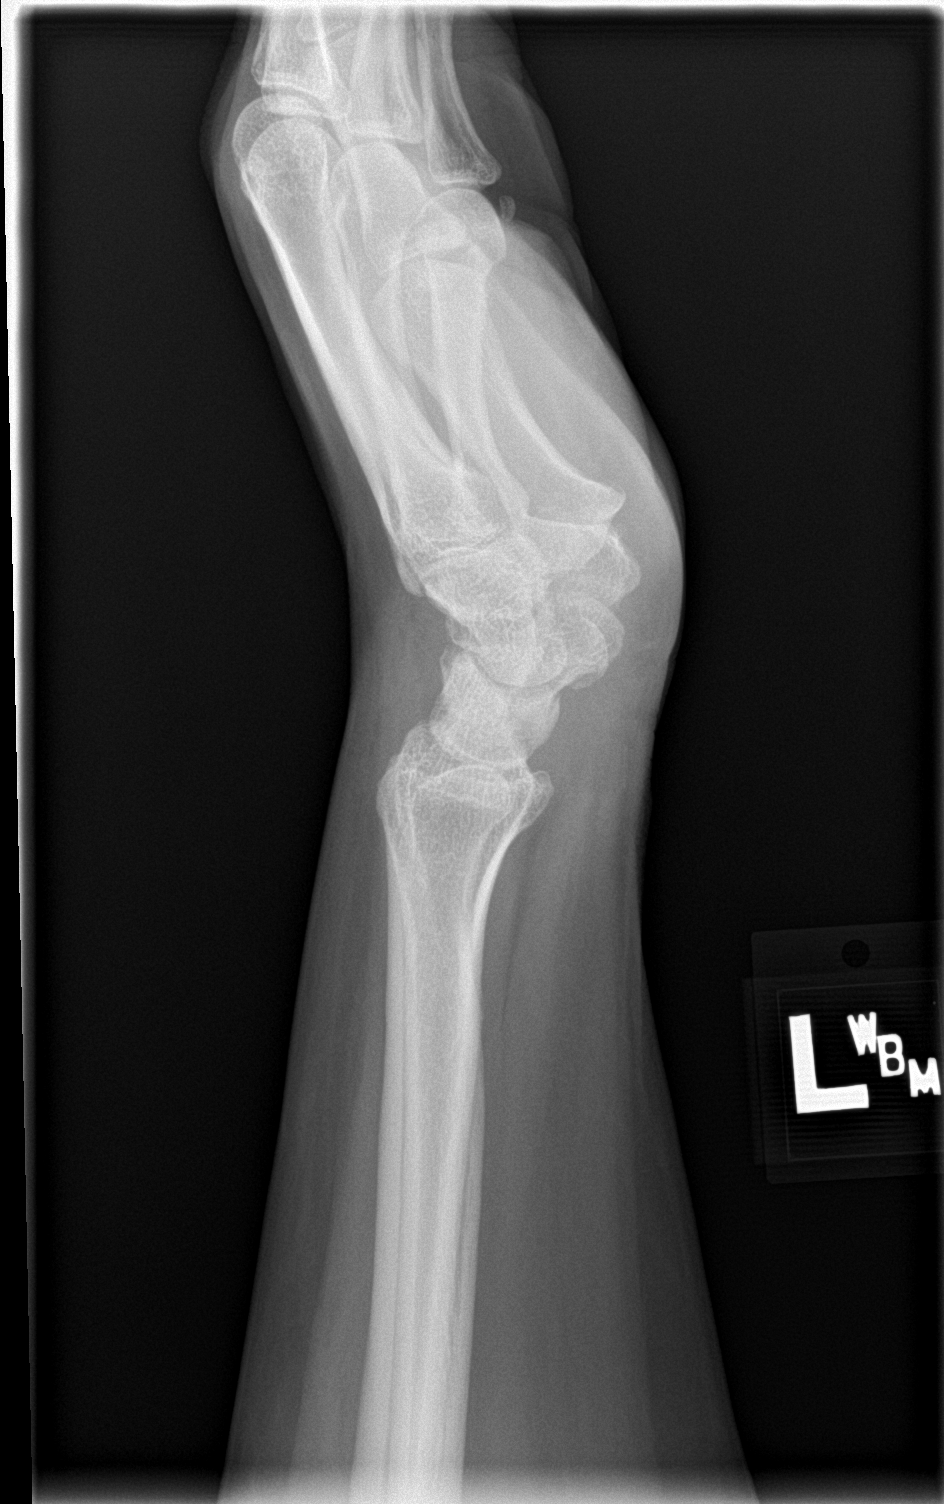

[wrist navicular]
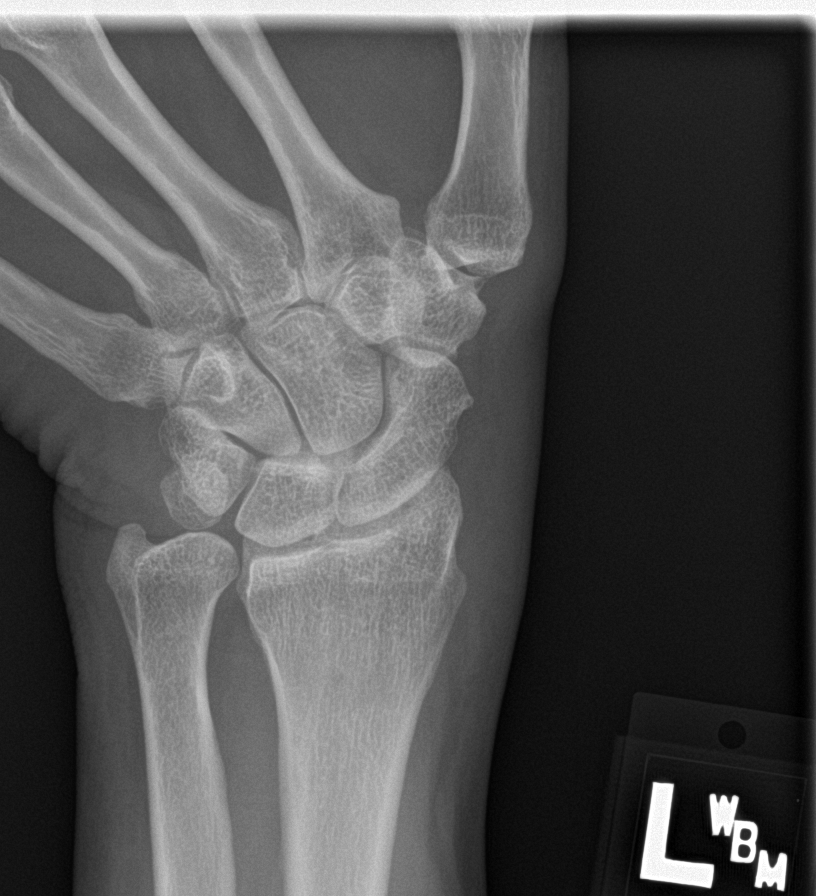

[wrist obl]
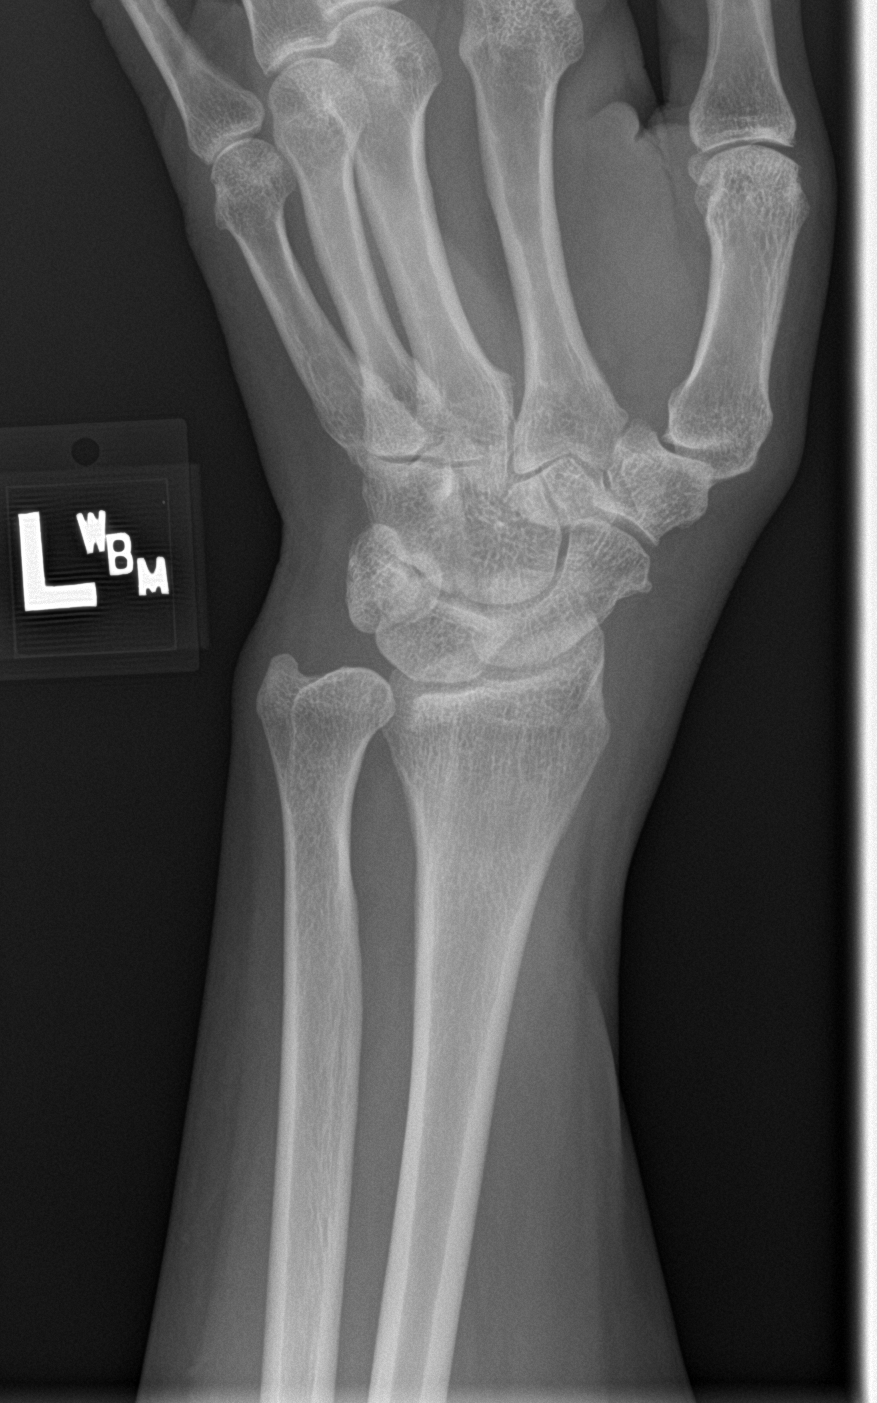

[4 of 4 positions shown; findings below may reference images not displayed]

FINDINGS: No acute fracture or dislocation. Mild degenerative changes of the
first CMC. No area of erosion or osseous destruction. No unexpected
radiopaque foreign body. Soft tissues are unremarkable.
IMPRESSION: No acute osseous abnormality.

## 2022-11-03 ENCOUNTER — Ambulatory Visit
Admission: RE | Admit: 2022-11-03 | Discharge: 2022-11-03 | Disposition: A | Discharge status: Home / Self Care | Payer: Federal, State, Local not specified - PPO | Source: Ambulatory Visit | Attending: Internal Medicine | Admitting: Internal Medicine

## 2022-11-03 DIAGNOSIS — Z1231 Encounter for screening mammogram for malignant neoplasm of breast: Secondary | ICD-10-CM | POA: Diagnosis not present

## 2022-11-05 ENCOUNTER — Other Ambulatory Visit: Payer: Self-pay | Admitting: Internal Medicine

## 2022-11-05 DIAGNOSIS — N6489 Other specified disorders of breast: Secondary | ICD-10-CM

## 2022-11-05 DIAGNOSIS — R928 Other abnormal and inconclusive findings on diagnostic imaging of breast: Secondary | ICD-10-CM

## 2022-11-17 ENCOUNTER — Encounter: Payer: Self-pay | Admitting: Internal Medicine

## 2022-11-17 ENCOUNTER — Ambulatory Visit (INDEPENDENT_AMBULATORY_CARE_PROVIDER_SITE_OTHER): Payer: Federal, State, Local not specified - PPO | Admitting: Internal Medicine

## 2022-11-17 VITALS — BP 134/86 | HR 83 | Temp 97.3°F | Wt 219.0 lb

## 2022-11-17 DIAGNOSIS — E7800 Pure hypercholesterolemia, unspecified: Secondary | ICD-10-CM | POA: Diagnosis not present

## 2022-11-17 DIAGNOSIS — D508 Other iron deficiency anemias: Secondary | ICD-10-CM | POA: Diagnosis not present

## 2022-11-17 DIAGNOSIS — I10 Essential (primary) hypertension: Secondary | ICD-10-CM | POA: Diagnosis not present

## 2022-11-17 DIAGNOSIS — E6609 Other obesity due to excess calories: Secondary | ICD-10-CM

## 2022-11-17 DIAGNOSIS — Z6834 Body mass index (BMI) 34.0-34.9, adult: Secondary | ICD-10-CM

## 2022-11-17 DIAGNOSIS — R7303 Prediabetes: Secondary | ICD-10-CM | POA: Diagnosis not present

## 2022-11-17 NOTE — Assessment & Plan Note (Signed)
A1c today Encourage low-carb diet and exercise for weight loss 

## 2022-11-17 NOTE — Assessment & Plan Note (Signed)
CBC today Continue oral iron 

## 2022-11-17 NOTE — Assessment & Plan Note (Signed)
Controlled on HCTZ Reinforced DASH diet and exercise for weight loss C-Met today 

## 2022-11-17 NOTE — Patient Instructions (Signed)

## 2022-11-17 NOTE — Progress Notes (Signed)
Subjective:    Patient ID: Felicia Jackson, female    DOB: [birth year], 52 y.o.   MRN: [id number]  HPI  Patient presents to clinic today for follow-up of chronic conditions.  HTN: Her BP today is 134/86.  She is taking HCTZ as prescribed.  There is no ECG on file.  Prediabetes: Her last A1c was 5.6%, 05/2022.  She is not taking any oral diabetic medication at this time.  She does not check her sugars.  HLD: Her last LDL was 123, triglycerides 83, 05/2022.  She denies myalgias on Simvastatin.  She does not consume low-fat diet.  Iron Deficiency Anemia: Her last H/H was 13.3/41.1, 05/2022.  She is taking oral Iron as prescribed.  She does not follow with hematology.   Review of Systems     Past Medical History:  Diagnosis Date   Hypertension     Current Outpatient Medications  Medication Sig Dispense Refill   ferrous sulfate 325 (65 FE) MG tablet Take 325 mg by mouth daily with breakfast.     hydrochlorothiazide (HYDRODIURIL) 25 MG tablet Take 1 tablet (25 mg total) by mouth daily. 90 tablet 1   simvastatin (ZOCOR) 10 MG tablet Take 1 tablet (10 mg total) by mouth at bedtime. 90 tablet 1   vitamin B-12 (CYANOCOBALAMIN) 1000 MCG tablet Take 1,000 mcg by mouth daily.     No current facility-administered medications for this visit.    No Known Allergies  Family History  Problem Relation Age of Onset   Heart attack Mother    Hypertension Father     Social History   Socioeconomic History   Marital status: Widowed    Spouse name: Not on file   Number of children: Not on file   Years of education: Not on file   Highest education level: Not on file  Occupational History   Not on file  Tobacco Use   Smoking status: Never   Smokeless tobacco: Never  Vaping Use   Vaping Use: Never used  Substance and Sexual Activity   Alcohol use: Not Currently   Drug use: Never   Sexual activity: Not on file  Other Topics Concern   Not on file  Social History Narrative   Not on file    Social Determinants of Health   Financial Resource Strain: Not on file  Food Insecurity: Not on file  Transportation Needs: Not on file  Physical Activity: Not on file  Stress: Not on file  Social Connections: Not on file  Intimate Partner Violence: Not on file     Constitutional: Denies fever, malaise, fatigue, headache or abrupt weight changes.  HEENT: Denies eye pain, eye redness, ear pain, ringing in the ears, wax buildup, runny nose, nasal congestion, bloody nose, or sore throat. Respiratory: Denies difficulty breathing, shortness of breath, cough or sputum production.   Cardiovascular: Denies chest pain, chest tightness, palpitations or swelling in the hands or feet.  Gastrointestinal: Denies abdominal pain, bloating, constipation, diarrhea or blood in the stool.  GU: Denies urgency, frequency, pain with urination, burning sensation, blood in urine, odor or discharge. Musculoskeletal: Denies decrease in range of motion, difficulty with gait, muscle pain or joint pain and swelling.  Skin: Denies redness, rashes, lesions or ulcercations.  Neurological: Denies dizziness, difficulty with memory, difficulty with speech or problems with balance and coordination.  Psych: Denies anxiety, depression, SI/HI.  No other specific complaints in a complete review of systems (except as listed in HPI above).  Objective:   Physical  Exam  BP 134/86 (BP Location: Left Arm, Patient Position: Sitting, Cuff Size: Large)   Pulse 83   Temp (!) 97.3 F (36.3 C) (Temporal)   Wt 219 lb (99.3 kg)   LMP 12/30/2011   SpO2 97%   BMI 34.30 kg/m   Wt Readings from Last 3 Encounters:  05/14/22 211 lb (95.7 kg)  10/16/21 206 lb (93.4 kg)  07/15/21 205 lb 6.4 oz (93.2 kg)    General: Appears her stated age, obese, in NAD. Skin: Warm, dry and intact.  HEENT: Head: normal shape and size; Eyes: sclera white, no icterus, conjunctiva pink, PERRLA and EOMs intact;  Cardiovascular: Normal rate and  rhythm. S1,S2 noted.  No murmur, rubs or gallops noted. No JVD or BLE edema. No carotid bruits noted. Pulmonary/Chest: Normal effort and positive vesicular breath sounds. No respiratory distress. No wheezes, rales or ronchi noted.  Musculoskeletal:  No difficulty with gait.  Neurological: Alert and oriented. Coordination normal.    BMET    Component Value Date/Time   NA 141 05/14/2022 1559   K 3.8 05/14/2022 1559   CL 104 05/14/2022 1559   CO2 32 05/14/2022 1559   GLUCOSE 79 05/14/2022 1559   BUN 13 05/14/2022 1559   CREATININE 0.85 05/14/2022 1559   CALCIUM 9.2 05/14/2022 1559    Lipid Panel     Component Value Date/Time   CHOL 195 05/14/2022 1559   TRIG 83 05/14/2022 1559   HDL 54 05/14/2022 1559   CHOLHDL 3.6 05/14/2022 1559   LDLCALC 123 (H) 05/14/2022 1559    CBC    Component Value Date/Time   WBC 4.6 05/14/2022 1559   RBC 4.88 05/14/2022 1559   HGB 13.3 05/14/2022 1559   HCT 41.1 05/14/2022 1559   PLT 308 05/14/2022 1559   MCV 84.2 05/14/2022 1559   MCH 27.3 05/14/2022 1559   MCHC 32.4 05/14/2022 1559   RDW 12.6 05/14/2022 1559   LYMPHSABS 0.9 12/29/2019 1133   MONOABS 0.2 12/29/2019 1133   EOSABS 0.0 12/29/2019 1133   BASOSABS 0.0 12/29/2019 1133    Hgb A1C Lab Results  Component Value Date   HGBA1C 5.6 05/14/2022            Assessment & Plan:     RTC in 6 months, for your annual exam Webb Silversmith, NP

## 2022-11-17 NOTE — Assessment & Plan Note (Signed)
C-Met and lipid profile today Encouraged her to consume low-fat diet Continue simvastatin 

## 2022-11-17 NOTE — Assessment & Plan Note (Signed)
Encourage diet and exercise for weight loss 

## 2022-11-18 LAB — COMPLETE METABOLIC PANEL WITH GFR
AG Ratio: 1 (calc) (ref 1.0–2.5)
ALT: 11 U/L (ref 6–29)
AST: 15 U/L (ref 10–35)
Albumin: 3.9 g/dL (ref 3.6–5.1)
Alkaline phosphatase (APISO): 101 U/L (ref 37–153)
BUN: 16 mg/dL (ref 7–25)
CO2: 29 mmol/L (ref 20–32)
Calcium: 9.1 mg/dL (ref 8.6–10.4)
Chloride: 104 mmol/L (ref 98–110)
Creat: 0.76 mg/dL (ref 0.50–1.03)
Globulin: 3.8 g/dL (calc) — ABNORMAL HIGH (ref 1.9–3.7)
Glucose, Bld: 102 mg/dL (ref 65–139)
Potassium: 3.5 mmol/L (ref 3.5–5.3)
Sodium: 141 mmol/L (ref 135–146)
Total Bilirubin: 0.4 mg/dL (ref 0.2–1.2)
Total Protein: 7.7 g/dL (ref 6.1–8.1)
eGFR: 94 mL/min/{1.73_m2} (ref >=60)

## 2022-11-18 LAB — HEMOGLOBIN A1C
Hgb A1c MFr Bld: 6 % of total Hgb — ABNORMAL HIGH (ref <5.7)
Mean Plasma Glucose: 126 mg/dL
eAG (mmol/L): 7 mmol/L

## 2022-11-18 LAB — CBC
HCT: 41.9 % (ref 35.0–45.0)
Hemoglobin: 13.5 g/dL (ref 11.7–15.5)
MCH: 26.6 pg — ABNORMAL LOW (ref 27.0–33.0)
MCHC: 32.2 g/dL (ref 32.0–36.0)
MCV: 82.5 fL (ref 80.0–100.0)
MPV: 9.2 fL (ref 7.5–12.5)
Platelets: 309 10*3/uL (ref 140–400)
RBC: 5.08 10*6/uL (ref 3.80–5.10)
RDW: 12.6 % (ref 11.0–15.0)
WBC: 5.8 10*3/uL (ref 3.8–10.8)

## 2022-11-18 LAB — LIPID PANEL
Cholesterol: 186 mg/dL (ref <200)
HDL: 61 mg/dL (ref >=50)
LDL Cholesterol (Calc): 111 mg/dL (calc) — ABNORMAL HIGH
Non-HDL Cholesterol (Calc): 125 mg/dL (calc) (ref <130)
Total CHOL/HDL Ratio: 3 (calc) (ref <5.0)
Triglycerides: 59 mg/dL (ref <150)

## 2022-11-24 ENCOUNTER — Ambulatory Visit
Admission: RE | Admit: 2022-11-24 | Discharge: 2022-11-24 | Disposition: A | Discharge status: Home / Self Care | Payer: Federal, State, Local not specified - PPO | Source: Ambulatory Visit | Attending: Internal Medicine | Admitting: Internal Medicine

## 2022-11-24 DIAGNOSIS — R928 Other abnormal and inconclusive findings on diagnostic imaging of breast: Secondary | ICD-10-CM

## 2022-11-24 DIAGNOSIS — N6489 Other specified disorders of breast: Secondary | ICD-10-CM | POA: Diagnosis not present

## 2023-05-18 ENCOUNTER — Encounter: Payer: Self-pay | Admitting: Internal Medicine

## 2023-05-18 ENCOUNTER — Ambulatory Visit (INDEPENDENT_AMBULATORY_CARE_PROVIDER_SITE_OTHER): Payer: Federal, State, Local not specified - PPO | Admitting: Internal Medicine

## 2023-05-18 VITALS — BP 136/86 | HR 73 | Temp 96.6°F | Ht 67.0 in | Wt 219.0 lb

## 2023-05-18 DIAGNOSIS — Z6834 Body mass index (BMI) 34.0-34.9, adult: Secondary | ICD-10-CM

## 2023-05-18 DIAGNOSIS — Z1211 Encounter for screening for malignant neoplasm of colon: Secondary | ICD-10-CM

## 2023-05-18 DIAGNOSIS — E6609 Other obesity due to excess calories: Secondary | ICD-10-CM

## 2023-05-18 DIAGNOSIS — E7800 Pure hypercholesterolemia, unspecified: Secondary | ICD-10-CM

## 2023-05-18 DIAGNOSIS — R7303 Prediabetes: Secondary | ICD-10-CM | POA: Diagnosis not present

## 2023-05-18 DIAGNOSIS — Z0001 Encounter for general adult medical examination with abnormal findings: Secondary | ICD-10-CM

## 2023-05-18 DIAGNOSIS — R87610 Atypical squamous cells of undetermined significance on cytologic smear of cervix (ASC-US): Secondary | ICD-10-CM

## 2023-05-18 DIAGNOSIS — N814 Uterovaginal prolapse, unspecified: Secondary | ICD-10-CM

## 2023-05-18 NOTE — Progress Notes (Signed)
Subjective:    Patient ID: Felicia Jackson, female    DOB: [birth year], 52 y.o.   MRN: [id number]  HPI  Patient presents to clinic today for annual exam.  Flu:  Tetanus: 03/2021 COVID: Shingrix: 03/2021, 05/2022 Pap smear: 05/2022, abnormal, repeat in 1 year Mammogram: 11/2022 Colon screening: Vision screening: Dentist:  Diet: Exercise:  Review of Systems     Past Medical History:  Diagnosis Date   Hypertension     Current Outpatient Medications  Medication Sig Dispense Refill   ferrous sulfate 325 (65 FE) MG tablet Take 325 mg by mouth daily with breakfast.     hydrochlorothiazide (HYDRODIURIL) 25 MG tablet Take 1 tablet (25 mg total) by mouth daily. 90 tablet 1   simvastatin (ZOCOR) 10 MG tablet Take 1 tablet (10 mg total) by mouth at bedtime. 90 tablet 1   vitamin B-12 (CYANOCOBALAMIN) 1000 MCG tablet Take 1,000 mcg by mouth daily.     No current facility-administered medications for this visit.    No Known Allergies  Family History  Problem Relation Age of Onset   Heart attack Mother    Hypertension Father     Social History   Socioeconomic History   Marital status: Widowed    Spouse name: Not on file   Number of children: Not on file   Years of education: Not on file   Highest education level: Not on file  Occupational History   Not on file  Tobacco Use   Smoking status: Never   Smokeless tobacco: Never  Vaping Use   Vaping status: Never Used  Substance and Sexual Activity   Alcohol use: Not Currently   Drug use: Never   Sexual activity: Not on file  Other Topics Concern   Not on file  Social History Narrative   Not on file   Social Determinants of Health   Financial Resource Strain: Not on file  Food Insecurity: Not on file  Transportation Needs: Not on file  Physical Activity: Not on file  Stress: Not on file  Social Connections: Not on file  Intimate Partner Violence: Not on file     Constitutional: Denies fever, malaise, fatigue,  headache or abrupt weight changes.  HEENT: Denies eye pain, eye redness, ear pain, ringing in the ears, wax buildup, runny nose, nasal congestion, bloody nose, or sore throat. Respiratory: Denies difficulty breathing, shortness of breath, cough or sputum production.   Cardiovascular: Denies chest pain, chest tightness, palpitations or swelling in the hands or feet.  Gastrointestinal: Denies abdominal pain, bloating, constipation, diarrhea or blood in the stool.  GU: Patient reports urinary urgency.  Denies frequency, pain with urination, burning sensation, blood in urine, odor or discharge. Musculoskeletal: Denies decrease in range of motion, difficulty with gait, muscle pain or joint pain and swelling.  Skin: Denies redness, rashes, lesions or ulcercations.  Neurological: Denies dizziness, difficulty with memory, difficulty with speech or problems with balance and coordination.  Psych: Denies anxiety, depression, SI/HI.  No other specific complaints in a complete review of systems (except as listed in HPI above).  Objective:   Physical Exam  BP 136/86 (BP Location: Right Arm, Patient Position: Sitting, Cuff Size: Large)   Pulse 73   Temp (!) 96.6 F (35.9 C) (Temporal)   Ht 5\' 7"  (1.702 m)   Wt 219 lb (99.3 kg)   LMP 12/30/2011   SpO2 98%   BMI 34.30 kg/m   Wt Readings from Last 3 Encounters:  11/17/22 219 lb (99.3 kg)  05/14/22 211 lb (95.7 kg)  10/16/21 206 lb (93.4 kg)    General: Appears her stated age, obese, in NAD. Skin: Warm, dry and intact.  HEENT: Head: normal shape and size; Eyes: sclera white, no icterus, conjunctiva pink, PERRLA and EOMs intact;  Neck:  Neck supple, trachea midline. No masses, lumps or thyromegaly present.  Cardiovascular: Normal rate and rhythm. S1,S2 noted.  No murmur, rubs or gallops noted. No JVD or BLE edema. No carotid bruits noted. Pulmonary/Chest: Normal effort and positive vesicular breath sounds. No respiratory distress. No wheezes, rales  or ronchi noted.  Abdomen: Soft and nontender. Normal bowel sounds.  Pelvic: Normal female anatomy. Unable to visualize cervix secondary to significant cystocele.  Adnexa nonpalpable. Musculoskeletal: Strength 5/5 BUE/BLE.  No difficulty with gait.  Neurological: Alert and oriented. Cranial nerves II-XII grossly intact. Coordination normal.  Psychiatric: Mood and affect normal. Behavior is normal. Judgment and thought content normal.    BMET    Component Value Date/Time   NA 141 11/17/2022 1537   K 3.5 11/17/2022 1537   CL 104 11/17/2022 1537   CO2 29 11/17/2022 1537   GLUCOSE 102 11/17/2022 1537   BUN 16 11/17/2022 1537   CREATININE 0.76 11/17/2022 1537   CALCIUM 9.1 11/17/2022 1537    Lipid Panel     Component Value Date/Time   CHOL 186 11/17/2022 1537   TRIG 59 11/17/2022 1537   HDL 61 11/17/2022 1537   CHOLHDL 3.0 11/17/2022 1537   LDLCALC 111 (H) 11/17/2022 1537    CBC    Component Value Date/Time   WBC 5.8 11/17/2022 1537   RBC 5.08 11/17/2022 1537   HGB 13.5 11/17/2022 1537   HCT 41.9 11/17/2022 1537   PLT 309 11/17/2022 1537   MCV 82.5 11/17/2022 1537   MCH 26.6 (L) 11/17/2022 1537   MCHC 32.2 11/17/2022 1537   RDW 12.6 11/17/2022 1537   LYMPHSABS 0.9 12/29/2019 1133   MONOABS 0.2 12/29/2019 1133   EOSABS 0.0 12/29/2019 1133   BASOSABS 0.0 12/29/2019 1133    Hgb A1C Lab Results  Component Value Date   HGBA1C 6.0 (H) 11/17/2022            Assessment & Plan:   Preventative health maintenance:  Flu declined Tetanus UTD Encouraged her to get her COVID-vaccine Shingrix UTD Pap smear attempted today but unable to visualize cervix due to severe cystocele, referral to urogynecology placed Mammogram UTD Referral to GI for screening colonoscopy Encouraged her to consume a balanced diet and exercise regimen Advised her to see an eye doctor and dentist annually We will check CBC, c-Met, lipid, A1c today  Cystocele with prolapse, abnormal Pap  smear of cervix:  Referral to urogynecology for further evaluation and treatment  RTC in 6 months, follow-up chronic conditions Nicki Reaper, NP

## 2023-05-18 NOTE — Patient Instructions (Signed)

## 2023-05-18 NOTE — Assessment & Plan Note (Signed)
Encourage diet and exercise for weight loss 

## 2023-05-19 LAB — COMPLETE METABOLIC PANEL WITH GFR
AG Ratio: 1 (calc) (ref 1.0–2.5)
ALT: 9 U/L (ref 6–29)
AST: 12 U/L (ref 10–35)
Albumin: 3.6 g/dL (ref 3.6–5.1)
Alkaline phosphatase (APISO): 78 U/L (ref 37–153)
BUN: 14 mg/dL (ref 7–25)
CO2: 29 mmol/L (ref 20–32)
Calcium: 8.6 mg/dL (ref 8.6–10.4)
Chloride: 105 mmol/L (ref 98–110)
Creat: 0.65 mg/dL (ref 0.50–1.03)
Globulin: 3.7 g/dL (ref 1.9–3.7)
Glucose, Bld: 93 mg/dL (ref 65–139)
Potassium: 4.1 mmol/L (ref 3.5–5.3)
Sodium: 139 mmol/L (ref 135–146)
Total Bilirubin: 0.3 mg/dL (ref 0.2–1.2)
Total Protein: 7.3 g/dL (ref 6.1–8.1)
eGFR: 105 mL/min/{1.73_m2} (ref >=60)

## 2023-05-19 LAB — LIPID PANEL
Cholesterol: 161 mg/dL (ref <200)
HDL: 43 mg/dL — ABNORMAL LOW (ref >=50)
LDL Cholesterol (Calc): 99 mg/dL
Non-HDL Cholesterol (Calc): 118 mg/dL (ref <130)
Total CHOL/HDL Ratio: 3.7 (calc) (ref <5.0)
Triglycerides: 93 mg/dL (ref <150)

## 2023-05-19 LAB — CBC
HCT: 38.8 % (ref 35.0–45.0)
Hemoglobin: 12.4 g/dL (ref 11.7–15.5)
MCH: 26.4 pg — ABNORMAL LOW (ref 27.0–33.0)
MCHC: 32 g/dL (ref 32.0–36.0)
MCV: 82.7 fL (ref 80.0–100.0)
MPV: 9.5 fL (ref 7.5–12.5)
Platelets: 279 10*3/uL (ref 140–400)
RBC: 4.69 10*6/uL (ref 3.80–5.10)
RDW: 12.4 % (ref 11.0–15.0)
WBC: 3.3 10*3/uL — ABNORMAL LOW (ref 3.8–10.8)

## 2023-05-19 LAB — HEMOGLOBIN A1C
Hgb A1c MFr Bld: 6 %{Hb} — ABNORMAL HIGH (ref <5.7)
Mean Plasma Glucose: 126 mg/dL
eAG (mmol/L): 7 mmol/L

## 2023-06-03 ENCOUNTER — Encounter: Payer: Self-pay | Admitting: Hematology and Oncology

## 2023-06-03 ENCOUNTER — Other Ambulatory Visit: Payer: Self-pay | Admitting: *Deleted

## 2023-06-03 ENCOUNTER — Telehealth: Payer: Self-pay | Admitting: *Deleted

## 2023-06-03 DIAGNOSIS — Z1211 Encounter for screening for malignant neoplasm of colon: Secondary | ICD-10-CM

## 2023-06-03 MED ORDER — PEG 3350-KCL-NABCB-NACL-NASULF 236 G PO SOLR: 4000.0000 mL | Freq: Once | ORAL | 0 refills | Status: AC

## 2023-06-03 NOTE — Telephone Encounter (Addendum)
Gastroenterology Pre-Procedure Review  Request Date: 07/14/2023 Requesting Physician: Dr. Allegra Lai  PATIENT REVIEW QUESTIONS: The patient responded to the following health history questions as indicated:    1. Are you having any GI issues? no 2. Do you have a personal history of Polyps? no 3. Do you have a family history of Colon Cancer or Polyps? no 4. Diabetes Mellitus? no 5. Joint replacements in the past 12 months?no 6. Major health problems in the past 3 months?no 7. Any artificial heart valves, MVP, or defibrillator?no    MEDICATIONS & ALLERGIES:    Patient reports the following regarding taking any anticoagulation/antiplatelet therapy:   Plavix, Coumadin, Eliquis, Xarelto, Lovenox, Pradaxa, Brilinta, or Effient? no Aspirin? no  Patient confirms/reports the following medications:  Current Outpatient Medications  Medication Sig Dispense Refill   ferrous sulfate 325 (65 FE) MG tablet Take 325 mg by mouth daily with breakfast.     hydrochlorothiazide (HYDRODIURIL) 25 MG tablet Take 1 tablet (25 mg total) by mouth daily. 90 tablet 1   simvastatin (ZOCOR) 10 MG tablet Take 1 tablet (10 mg total) by mouth at bedtime. 90 tablet 1   vitamin B-12 (CYANOCOBALAMIN) 1000 MCG tablet Take 1,000 mcg by mouth daily.     No current facility-administered medications for this visit.    Patient confirms/reports the following allergies:  No Known Allergies  No orders of the defined types were placed in this encounter.   AUTHORIZATION INFORMATION Primary Insurance: 1D#: Group #:  Secondary Insurance: 1D#: Group #:  SCHEDULE INFORMATION: Date: 07/14/2023 Time: Location: [facility]

## 2023-06-04 NOTE — Telephone Encounter (Signed)
Patient called back and need to change the date. Requesting to change to 07/14/2023

## 2023-07-14 ENCOUNTER — Ambulatory Visit: Payer: Federal, State, Local not specified - PPO | Admitting: Anesthesiology

## 2023-07-14 ENCOUNTER — Ambulatory Visit
Admission: RE | Admit: 2023-07-14 | Discharge: 2023-07-14 | Disposition: A | Discharge status: Home / Self Care | Payer: Federal, State, Local not specified - PPO | Attending: Gastroenterology | Admitting: Gastroenterology

## 2023-07-14 ENCOUNTER — Encounter
Admission: RE | Disposition: A | Discharge status: Home / Self Care | Payer: Self-pay | Source: Home / Self Care | Attending: Gastroenterology

## 2023-07-14 DIAGNOSIS — E669 Obesity, unspecified: Secondary | ICD-10-CM | POA: Diagnosis not present

## 2023-07-14 DIAGNOSIS — D649 Anemia, unspecified: Secondary | ICD-10-CM | POA: Diagnosis not present

## 2023-07-14 DIAGNOSIS — Z6833 Body mass index (BMI) 33.0-33.9, adult: Secondary | ICD-10-CM | POA: Diagnosis not present

## 2023-07-14 DIAGNOSIS — Z1211 Encounter for screening for malignant neoplasm of colon: Secondary | ICD-10-CM | POA: Diagnosis not present

## 2023-07-14 DIAGNOSIS — I10 Essential (primary) hypertension: Secondary | ICD-10-CM | POA: Diagnosis not present

## 2023-07-14 HISTORY — PX: COLONOSCOPY WITH PROPOFOL: SHX5780

## 2023-07-14 SURGERY — COLONOSCOPY WITH PROPOFOL
Anesthesia: General

## 2023-07-14 MED ORDER — SODIUM CHLORIDE 0.9 % IV SOLN
INTRAVENOUS | Status: DC
  Administered 2023-07-14: 20 mL/h via INTRAVENOUS

## 2023-07-14 MED ORDER — PROPOFOL 10 MG/ML IV BOLUS
INTRAVENOUS | Status: DC | PRN
  Administered 2023-07-14 (×2): 20 mg via INTRAVENOUS
  Administered 2023-07-14: 60 mg via INTRAVENOUS
  Administered 2023-07-14: 20 mg via INTRAVENOUS

## 2023-07-14 MED ORDER — LIDOCAINE HCL (CARDIAC) PF 100 MG/5ML IV SOSY
PREFILLED_SYRINGE | INTRAVENOUS | Status: DC | PRN
  Administered 2023-07-14: 100 mg via INTRAVENOUS

## 2023-07-14 MED ORDER — GLYCOPYRROLATE 0.2 MG/ML IJ SOLN
INTRAMUSCULAR | Status: DC | PRN
  Administered 2023-07-14: .2 mg via INTRAVENOUS

## 2023-07-14 MED ORDER — PROPOFOL 500 MG/50ML IV EMUL
INTRAVENOUS | Status: DC | PRN
  Administered 2023-07-14: 165 ug/kg/min via INTRAVENOUS

## 2023-07-14 NOTE — Anesthesia Preprocedure Evaluation (Addendum)
Anesthesia Evaluation  Patient identified by MRN, date of birth, ID band Patient awake    Reviewed: Allergy & Precautions, NPO status , Patient's Chart, lab work & pertinent test results  History of Anesthesia Complications Negative for: history of anesthetic complications  Airway Mallampati: III   Neck ROM: Full    Dental  (+) Poor Dentition   Pulmonary neg pulmonary ROS   Pulmonary exam normal breath sounds clear to auscultation       Cardiovascular hypertension, Normal cardiovascular exam Rhythm:Regular Rate:Normal     Neuro/Psych negative neurological ROS     GI/Hepatic negative GI ROS,,,  Endo/Other  Obesity; prediabetes  Renal/GU negative Renal ROS     Musculoskeletal   Abdominal   Peds  Hematology  (+) Blood dyscrasia, anemia   Anesthesia Other Findings   Reproductive/Obstetrics                             Anesthesia Physical Anesthesia Plan  ASA: 2  Anesthesia Plan: General   Post-op Pain Management:    Induction: Intravenous  PONV Risk Score and Plan: 3 and Propofol infusion, TIVA and Treatment may vary due to age or medical condition  Airway Management Planned: Natural Airway  Additional Equipment:   Intra-op Plan:   Post-operative Plan:   Informed Consent: I have reviewed the patients History and Physical, chart, labs and discussed the procedure including the risks, benefits and alternatives for the proposed anesthesia with the patient or authorized representative who has indicated his/her understanding and acceptance.       Plan Discussed with: CRNA  Anesthesia Plan Comments: (LMA/GETA backup discussed.  Patient consented for risks of anesthesia including but not limited to:  - adverse reactions to medications - damage to eyes, teeth, lips or other oral mucosa - nerve damage due to positioning  - sore throat or hoarseness - damage to heart, brain,  nerves, lungs, other parts of body or loss of life  Informed patient about role of CRNA in peri- and intra-operative care.  Patient voiced understanding.)       Anesthesia Quick Evaluation

## 2023-07-14 NOTE — Transfer of Care (Signed)
Immediate Anesthesia Transfer of Care Note  Patient: Felicia Jackson  Procedure(s) Performed: COLONOSCOPY WITH PROPOFOL  Patient Location: Endoscopy Unit  Anesthesia Type:General  Level of Consciousness: drowsy and patient cooperative  Airway & Oxygen Therapy: Patient Spontanous Breathing and Patient connected to face mask oxygen  Post-op Assessment: Report given to RN and Post -op Vital signs reviewed and stable  Post vital signs: Reviewed and stable  Last Vitals:  Vitals Value Taken Time  BP 135/91 07/14/23 0941  Temp 35.8 C 07/14/23 0941  Pulse 85 07/14/23 0942  Resp 35 07/14/23 0942  SpO2 100 % 07/14/23 0942  Vitals shown include unfiled device data.  Last Pain:  Vitals:   07/14/23 0941  TempSrc: Temporal  PainSc: Asleep         Complications: No notable events documented.

## 2023-07-14 NOTE — Anesthesia Postprocedure Evaluation (Signed)
Anesthesia Post Note  Patient: Felicia Jackson  Procedure(s) Performed: COLONOSCOPY WITH PROPOFOL  Patient location during evaluation: PACU Anesthesia Type: General Level of consciousness: awake and alert, oriented and patient cooperative Pain management: pain level controlled Vital Signs Assessment: post-procedure vital signs reviewed and stable Respiratory status: spontaneous breathing, nonlabored ventilation and respiratory function stable Cardiovascular status: blood pressure returned to baseline and stable Postop Assessment: adequate PO intake Anesthetic complications: no   No notable events documented.   Last Vitals:  Vitals:   07/14/23 0951 07/14/23 1001  BP: (!) 141/89 (!) 172/93  Pulse: 80 82  Resp: 13 16  Temp:    SpO2: 100% 100%    Last Pain:  Vitals:   07/14/23 1001  TempSrc:   PainSc: 5                  Reed Breech

## 2023-07-14 NOTE — H&P (Signed)
  Arlyss Repress, MD [address]  [address]  [place], [place] [address]  Main: [phone]  Fax: [phone] Pager: [phone]  Primary Care Physician:  Lorre Munroe, NP Primary Gastroenterologist:  Dr. Arlyss Repress  Pre-Procedure History & Physical: HPI:  Felicia Jackson is a 52 y.o. female is here for an colonoscopy.   Past Medical History:  Diagnosis Date   Hypertension     No past surgical history on file.  Prior to Admission medications   Medication Sig Start Date End Date Taking? Authorizing Provider  ferrous sulfate 325 (65 FE) MG tablet Take 325 mg by mouth daily with breakfast.   Yes [provider]  hydrochlorothiazide (HYDRODIURIL) 25 MG tablet Take 1 tablet (25 mg total) by mouth daily. 05/14/22  Yes Lorre Munroe, NP  simvastatin (ZOCOR) 10 MG tablet Take 1 tablet (10 mg total) by mouth at bedtime. 05/14/22  Yes Baity, Salvadore Oxford, NP  vitamin B-12 (CYANOCOBALAMIN) 1000 MCG tablet Take 1,000 mcg by mouth daily.   Yes [provider]    Allergies as of 06/03/2023   (No Known Allergies)    Family History  Problem Relation Age of Onset   Heart attack Mother    Hypertension Father     Social History   Socioeconomic History   Marital status: Widowed    Spouse name: Not on file   Number of children: Not on file   Years of education: Not on file   Highest education level: Not on file  Occupational History   Not on file  Tobacco Use   Smoking status: Never   Smokeless tobacco: Never  Vaping Use   Vaping status: Never Used  Substance and Sexual Activity   Alcohol use: Not Currently   Drug use: Never   Sexual activity: Not on file  Other Topics Concern   Not on file  Social History Narrative   Not on file   Social Determinants of Health   Financial Resource Strain: Not on file  Food Insecurity: Not on file  Transportation Needs: Not on file  Physical Activity: Not on file  Stress: Not on file  Social Connections: Not  on file  Intimate Partner Violence: Not on file    Review of Systems: See HPI, otherwise negative ROS  Physical Exam: BP (!) 196/79   Pulse (!) 53   Temp (!) 96.7 F (35.9 C) (Temporal)   Resp 20   Ht 5\' 7"  (1.702 m)   Wt 97.3 kg   LMP 12/30/2011   SpO2 100%   BMI 33.58 kg/m  General:   Alert,  pleasant and cooperative in NAD Head:  Normocephalic and atraumatic. Neck:  Supple; no masses or thyromegaly. Lungs:  Clear throughout to auscultation.    Heart:  Regular rate and rhythm. Abdomen:  Soft, nontender and nondistended. Normal bowel sounds, without guarding, and without rebound.   Neurologic:  Alert and  oriented x4;  grossly normal neurologically.  Impression/Plan: Felicia Jackson is here for a colonoscopy to be performed for colon cancer screening  Risks, benefits, limitations, and alternatives regarding  colonoscopy have been reviewed with the patient.  Questions have been answered.  All parties agreeable.   Lannette Donath, MD  07/14/2023, 8:36 AM

## 2023-07-14 NOTE — Anesthesia Procedure Notes (Signed)
Procedure Name: General with mask airway Date/Time: 07/14/2023 9:17 AM  Performed by: Mohammed Kindle, CRNAPre-anesthesia Checklist: Patient identified, Emergency Drugs available, Suction available and Patient being monitored Patient Re-evaluated:Patient Re-evaluated prior to induction Oxygen Delivery Method: Simple face mask Induction Type: IV induction Placement Confirmation: positive ETCO2, CO2 detector and breath sounds checked- equal and bilateral Dental Injury: Teeth and Oropharynx as per pre-operative assessment

## 2023-07-14 NOTE — Op Note (Signed)
[facility]enter[facility] Patient Name: Felicia Jackson Procedure Date: 07/14/2023 9:13 AM MRN: [id number] Account #: [id number] Date of Birth: 1970-09-29 Admit Type: Outpatient Age: 52 Room: [facility] ENDO ROOM 1 Gender: Female Note Status: Finalized Instrument Name: Nelda Marseille [id number] Procedure:             Colonoscopy Indications:           Screening for colorectal malignant neoplasm, This is                         the patient's first colonoscopy Providers:             Toney Reil MD, MD Referring MD:          Lorre Munroe (Referring MD) Medicines:             General Anesthesia Complications:         No immediate complications. Estimated blood loss: None. Procedure:             Pre-Anesthesia Assessment:                        - Prior to the procedure, a History and Physical was                         performed, and patient medications and allergies were                         reviewed. The patient is competent. The risks and                         benefits of the procedure and the sedation options and                         risks were discussed with the patient. All questions                         were answered and informed consent was obtained.                         Patient identification and proposed procedure were                         verified by the physician, the nurse, the                         anesthesiologist, the anesthetist and the technician                         in the pre-procedure area in the procedure room in the                         endoscopy suite. Mental Status Examination: alert and                         oriented. Airway Examination: normal oropharyngeal                         airway and neck mobility. Respiratory Examination:  clear to auscultation. CV Examination: normal.                         Prophylactic Antibiotics: The patient does not require                         prophylactic  antibiotics. Prior Anticoagulants: The                         patient has taken no anticoagulant or antiplatelet                         agents. ASA Grade Assessment: II - A patient with mild                         systemic disease. After reviewing the risks and                         benefits, the patient was deemed in satisfactory                         condition to undergo the procedure. The anesthesia                         plan was to use general anesthesia. Immediately prior                         to administration of medications, the patient was                         re-assessed for adequacy to receive sedatives. The                         heart rate, respiratory rate, oxygen saturations,                         blood pressure, adequacy of pulmonary ventilation, and                         response to care were monitored throughout the                         procedure. The physical status of the patient was                         re-assessed after the procedure.                        After obtaining informed consent, the colonoscope was                         passed under direct vision. Throughout the procedure,                         the patient's blood pressure, pulse, and oxygen                         saturations were monitored continuously. The  Colonoscope was introduced through the anus and                         advanced to the the cecum, identified by appendiceal                         orifice and ileocecal valve. The colonoscopy was                         performed with moderate difficulty due to significant                         looping and the patient's body habitus. Successful                         completion of the procedure was aided by applying                         abdominal pressure. The patient tolerated the                         procedure well. The quality of the bowel preparation                         was  evaluated using the BBPS [facility] Bowel Preparation                         Scale) with scores of: Right Colon = 3, Transverse                         Colon = 3 and Left Colon = 3 (entire mucosa seen well                         with no residual staining, small fragments of stool or                         opaque liquid). The total BBPS score equals 9. The                         ileocecal valve, appendiceal orifice, and rectum were                         photographed. Findings:      The perianal and digital rectal examinations were normal. Pertinent       negatives include normal sphincter tone and no palpable rectal lesions.      The entire examined colon appeared normal.      The entire examined colon appeared normal on direct and retroflexion       views. Impression:            - The entire examined colon is normal.                        - The entire examined colon is normal on direct and                         retroflexion views.                        -  No specimens collected. Recommendation:        - Discharge patient to home (with escort).                        - Resume previous diet today.                        - Continue present medications.                        - Repeat colonoscopy in 10 years for screening                         purposes. Procedure Code(s):     --- Professional ---                        G4010, Colorectal cancer screening; colonoscopy on                         individual not meeting criteria for high risk Diagnosis Code(s):     --- Professional ---                        Z12.11, Encounter for screening for malignant neoplasm                         of colon CPT copyright 2022 [employer]. All rights reserved. The codes documented in this report are preliminary and upon coder review may  be revised to meet current compliance requirements. Dr. Libby Maw Toney Reil MD, MD 07/14/2023 9:41:36 AM This report has been signed  electronically. Number of Addenda: 0 Note Initiated On: 07/14/2023 9:13 AM Scope Withdrawal Time: 0 hours 8 minutes 50 seconds  Total Procedure Duration: 0 hours 17 minutes 18 seconds  Estimated Blood Loss:  Estimated blood loss: none.      [facility]

## 2023-07-15 ENCOUNTER — Encounter: Payer: Self-pay | Admitting: Gastroenterology

## 2023-08-09 ENCOUNTER — Ambulatory Visit: Payer: Federal, State, Local not specified - PPO | Admitting: Urology

## 2023-08-09 ENCOUNTER — Encounter: Payer: Self-pay | Admitting: Urology

## 2023-08-09 VITALS — BP 169/94 | HR 60

## 2023-08-09 DIAGNOSIS — N814 Uterovaginal prolapse, unspecified: Secondary | ICD-10-CM | POA: Diagnosis not present

## 2023-08-09 LAB — MICROSCOPIC EXAMINATION: WBC, UA: >30 /[HPF] — AB (ref 0–5)

## 2023-08-09 LAB — URINALYSIS, COMPLETE
Bilirubin, UA: NEGATIVE
Glucose, UA: NEGATIVE
Ketones, UA: NEGATIVE
Nitrite, UA: NEGATIVE
Specific Gravity, UA: 1.02 (ref 1.005–1.030)
Urobilinogen, Ur: 0.2 mg/dL (ref 0.2–1.0)
pH, UA: 8.5 — ABNORMAL HIGH (ref 5.0–7.5)

## 2023-08-09 LAB — BLADDER SCAN AMB NON-IMAGING: Scan Result: 20

## 2023-08-09 NOTE — Progress Notes (Signed)
08/09/2023 9:44 AM   Felicia Jackson 01-02-1971 [id number]  Referring provider: Lorre Munroe, NP [address] [place],  [place] [address]  Chief Complaint  Patient presents with   Establish Care    HPI: I was consulted to assess the patient's prolapse symptoms.  She feels vaginal bulging.  She sometimes reduces it manually.  She does not utilize splinting maneuvers.  No hysterectomy.  Sometimes she has urge incontinence and stress incontinence with coughing and sneezing but not on every occasion.  She wears 1 pad a day that can be damp.  She is a mail deliver  She voids every 2 or 3 hours and gets up once at night.  Flow is variable and sometimes she does not feel empty  No daily aspirin or blood thinner.  Has never smoked  No history of kidney stones bladder surgery or bladder infections.  Bowel function normal.  No treatment.  No neurologic issues   PMH: Past Medical History:  Diagnosis Date   Hypertension     Surgical History: Past Surgical History:  Procedure Laterality Date   COLONOSCOPY WITH PROPOFOL N/A 07/14/2023   Procedure: COLONOSCOPY WITH PROPOFOL;  Surgeon: Toney Reil, MD;  Location: [facility] ENDOSCOPY;  Service: Gastroenterology;  Laterality: N/A;    Home Medications:  Allergies as of 08/09/2023   No Known Allergies      Medication List        Accurate as of August 09, 2023  9:44 AM. If you have any questions, ask your nurse or doctor.          cyanocobalamin 1000 MCG tablet Commonly known as: VITAMIN B12 Take 1,000 mcg by mouth daily.   ferrous sulfate 325 (65 FE) MG tablet Take 325 mg by mouth daily with breakfast.   hydrochlorothiazide 25 MG tablet Commonly known as: HYDRODIURIL Take 1 tablet (25 mg total) by mouth daily.   simvastatin 10 MG tablet Commonly known as: ZOCOR Take 1 tablet (10 mg total) by mouth at bedtime.        Allergies: No Known Allergies  Family History: Family History  Problem Relation Age of Onset    Heart attack Mother    Hypertension Father     Social History:  reports that she has never smoked. She has never used smokeless tobacco. She reports that she does not currently use alcohol. She reports that she does not use drugs.  ROS:                                        Physical Exam: BP (!) 169/94   Pulse 60   LMP 12/30/2011   Constitutional:  Alert and oriented, No acute distress. HEENT: [place] AT, moist mucus membranes.  Trachea midline, no masses. Cardiovascular: No clubbing, cyanosis, or edema. Respiratory: Normal respiratory effort, no increased work of breathing. GI: Abdomen is soft, nontender, nondistended, no abdominal masses GU: On pelvic examination patient had a grade 3 cystocele with moderate central defect.  She could only do a moderate Valsalva but the prolapse reach the introitus.  She did have hypermobility of the urethra but no stress incontinence.  Uterus descended from 8 or 9 cm to approximately 5 cm.  She had minimal rectocele. Skin: No rashes, bruises or suspicious lesions. Lymph: No cervical or inguinal adenopathy. Neurologic: Grossly intact, no focal deficits, moving all 4 extremities. Psychiatric: Normal mood and affect.  Laboratory  Data: Lab Results  Component Value Date   WBC 3.3 (L) 05/18/2023   HGB 12.4 05/18/2023   HCT 38.8 05/18/2023   MCV 82.7 05/18/2023   PLT 279 05/18/2023    Lab Results  Component Value Date   CREATININE 0.65 05/18/2023    No results found for: "PSA"  No results found for: "TESTOSTERONE"  Lab Results  Component Value Date   HGBA1C 6.0 (H) 05/18/2023    Urinalysis No results found for: "COLORURINE", "APPEARANCEUR", "LABSPEC", "PHURINE", "GLUCOSEU", "HGBUR", "BILIRUBINUR", "KETONESUR", "PROTEINUR", "UROBILINOGEN", "NITRITE", "LEUKOCYTESUR"  Pertinent Imaging: Urine reviewed.  Chart reviewed.  Assessment & Plan: Patient has prolapse symptoms and mild mixed incontinence.  Picture drawn.   Role of urodynamics and cystoscopy and eventual referral to my practice discussed.  Her postvoid residual was 20 mL.  Urine was sent for culture.  I will check again next time to make certain she has no blood in the urine.  Likely would best benefit from a hysterectomy in combination with vault suspension with cystocele repair.  Patient agreed with the plan.  Flow symptoms may be based on presence of prolapse  1. Cystocele with prolapse  - Urinalysis, Complete   No follow-ups on file.  Martina Sinner, MD  [facility] [address], [address] [place], [place] [address] [phone]

## 2023-08-09 NOTE — Patient Instructions (Signed)

## 2023-08-12 LAB — CULTURE, URINE COMPREHENSIVE

## 2023-09-15 DIAGNOSIS — N3941 Urge incontinence: Secondary | ICD-10-CM | POA: Diagnosis not present

## 2023-09-30 ENCOUNTER — Other Ambulatory Visit: Payer: Self-pay | Admitting: Internal Medicine

## 2023-09-30 DIAGNOSIS — Z1231 Encounter for screening mammogram for malignant neoplasm of breast: Secondary | ICD-10-CM

## 2023-10-25 ENCOUNTER — Ambulatory Visit: Payer: Federal, State, Local not specified - PPO | Admitting: Urology

## 2023-10-25 VITALS — BP 194/93 | HR 73

## 2023-10-25 DIAGNOSIS — N814 Uterovaginal prolapse, unspecified: Secondary | ICD-10-CM | POA: Diagnosis not present

## 2023-10-25 DIAGNOSIS — R319 Hematuria, unspecified: Secondary | ICD-10-CM

## 2023-10-25 LAB — URINALYSIS, COMPLETE
Bilirubin, UA: NEGATIVE
Glucose, UA: NEGATIVE
Ketones, UA: NEGATIVE
Leukocytes,UA: NEGATIVE
Nitrite, UA: NEGATIVE
Protein,UA: NEGATIVE
Specific Gravity, UA: 1.02 (ref 1.005–1.030)
Urobilinogen, Ur: 0.2 mg/dL (ref 0.2–1.0)
pH, UA: 6.5 (ref 5.0–7.5)

## 2023-10-25 LAB — MICROSCOPIC EXAMINATION

## 2023-10-25 NOTE — Progress Notes (Signed)
 10/25/2023 9:47 AM   Felicia Jackson [birth year] [id number]  Referring provider: Lorre Munroe, NP [address] [place],  [place] [address]  Chief Complaint  Patient presents with   Cysto    HPI: I was consulted to assess the patient's prolapse symptoms.  She feels vaginal bulging.  She sometimes reduces it manually.  She does not utilize splinting maneuvers.  No hysterectomy.   Sometimes she has urge incontinence and stress incontinence with coughing and sneezing but not on every occasion.  She wears 1 pad a day that can be damp.  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  Flow is variable and sometimes she does not feel empty   No daily aspirin or blood thinner.  Has never smoked    On pelvic examination patient had a grade 3 cystocele with moderate central defect. She could only do a moderate Valsalva but the prolapse reach the introitus. She did have hypermobility of the urethra but no stress incontinence. Uterus descended from 8 or 9 cm to approximately 5 cm. She had minimal rectocele.   Patient has prolapse symptoms and mild mixed incontinence.  Picture drawn.  Role of urodynamics and cystoscopy and eventual referral to my practice discussed.  Her postvoid residual was 20 mL.  Urine was sent for culture.  I will check again next time to make certain she has no blood in the urine.  Likely would best benefit from a hysterectomy in combination with vault suspension with cystocele repair.  Patient agreed with the plan.  Flow symptoms may be based on presence of prolapse   Today Frequency stable.  Prolapse stable.  Incontinence stable.  Last culture negative During urodynamics patient did not void and was catheterized for 100 mL.  Maximum bladder capacity was 226 mL.  She had increased bladder sensation.  She had significant bladder overactivity with multiple overactive contractions.  She had urgency and leaked a mild to moderate amount.  Maximum  pressure reach 58 cm of water associated with incontinence.  She had no stress incontinence but only generated a Valsalva pressure of approximately 50 to 60 cm of water.  She may have been triggering overactivity as well.  During voiding she could not generate a detrusor contraction.  She could not void in the lab setting.  She then voided in the private restroom and emptied efficiently with a residual 26 mL.  EMG activity normal.  Large cystocele noted.  The details of the urodynamics are signed dictated  On repeat pelvic examination cystocele reach the introitus.  The prolapse was reduced to perform cystoscopy. Cystoscopy: Patient underwent flexible cystoscopy.  Bladder mucosa and trigone were normal.  Cystocele noted.  Urethra normal.  No mucosal abnormalities.  I was a bit surprised when I removed the scope there was some blood on the tip.  I did have a little bit of trouble initially inserting the cystoscope.  This was explained to patient that her tissues are dry and she had a little secondary bleeding.      PMH: Past Medical History:  Diagnosis Date   Hypertension     Surgical History: Past Surgical History:  Procedure Laterality Date   COLONOSCOPY WITH PROPOFOL N/A 07/14/2023   Procedure: COLONOSCOPY WITH PROPOFOL;  Surgeon: Toney Reil, MD;  Location: [facility] ENDOSCOPY;  Service: Gastroenterology;  Laterality: N/A;    Home Medications:  Allergies as of 10/25/2023   No Known Allergies  Medication List        Accurate as of October 25, 2023  9:47 AM. If you have any questions, ask your nurse or doctor.          cyanocobalamin 1000 MCG tablet Commonly known as: VITAMIN B12 Take 1,000 mcg by mouth daily.   ferrous sulfate 325 (65 FE) MG tablet Take 325 mg by mouth daily with breakfast.   hydrochlorothiazide 25 MG tablet Commonly known as: HYDRODIURIL Take 1 tablet (25 mg total) by mouth daily.   simvastatin 10 MG tablet Commonly known as: ZOCOR Take 1  tablet (10 mg total) by mouth at bedtime.        Allergies: No Known Allergies  Family History: Family History  Problem Relation Age of Onset   Heart attack Mother    Hypertension Father     Social History:  reports that she has never smoked. She has never used smokeless tobacco. She reports that she does not currently use alcohol. She reports that she does not use drugs.  ROS:                                        Physical Exam: LMP 12/30/2011   Constitutional:  Alert and oriented, No acute distress. HEENT: [place] AT, moist mucus membranes.  Trachea midline, no masses.  Laboratory Data: Lab Results  Component Value Date   WBC 3.3 (L) 05/18/2023   HGB 12.4 05/18/2023   HCT 38.8 05/18/2023   MCV 82.7 05/18/2023   PLT 279 05/18/2023    Lab Results  Component Value Date   CREATININE 0.65 05/18/2023    No results found for: "PSA"  No results found for: "TESTOSTERONE"  Lab Results  Component Value Date   HGBA1C 6.0 (H) 05/18/2023    Urinalysis    Component Value Date/Time   APPEARANCEUR Cloudy (A) 08/09/2023 0940   GLUCOSEU Negative 08/09/2023 0940   BILIRUBINUR Negative 08/09/2023 0940   PROTEINUR 2+ (A) 08/09/2023 0940   NITRITE Negative 08/09/2023 0940   LEUKOCYTESUR 1+ (A) 08/09/2023 0940    Pertinent Imaging:   Assessment & Plan: Patient has significant prolapse.  She has mild mixed incontinence and a significant overactive bladder on urodynamics role of robotic hysterectomy and prolapse repair discussed.  She understands that she has an overactive bladder and mild stress incontinence.  I do not think a sling would be in her best interest at this stage but she could have persistent or worsening stress incontinence after prolapse surgery.  Certainly her overactive bladder could persist or worsen.  Watchful waiting and treating overactive bladder discussed.  Role of physical therapy and pessary discussed.  Patient appeared very quiet  today also noted by nurses.  The patient actually is doing much better.  She is not bothered by the bulging.  We both agree at this stage watchful waiting.  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  She still has microscopic hematuria call with CT scan results.  She agreed with the plan.  Otherwise see as needed  1. Cystocele with prolapse (Primary)  - Urinalysis, Complete   No follow-ups on file.  Martina Sinner, MD  [facility] [address], [address] [place], [place] [address] [phone]

## 2023-10-28 LAB — CULTURE, URINE COMPREHENSIVE

## 2023-11-05 ENCOUNTER — Ambulatory Visit
Admission: RE | Admit: 2023-11-05 | Discharge: 2023-11-05 | Disposition: A | Discharge status: Home / Self Care | Payer: Federal, State, Local not specified - PPO | Source: Ambulatory Visit | Attending: Internal Medicine | Admitting: Internal Medicine

## 2023-11-05 DIAGNOSIS — Z1231 Encounter for screening mammogram for malignant neoplasm of breast: Secondary | ICD-10-CM | POA: Diagnosis not present

## 2023-11-09 ENCOUNTER — Encounter: Payer: Self-pay | Admitting: Family Medicine

## 2023-11-10 ENCOUNTER — Other Ambulatory Visit: Payer: Self-pay | Admitting: Internal Medicine

## 2023-11-10 DIAGNOSIS — R928 Other abnormal and inconclusive findings on diagnostic imaging of breast: Secondary | ICD-10-CM

## 2023-11-16 ENCOUNTER — Encounter: Payer: Self-pay | Admitting: Internal Medicine

## 2023-11-16 ENCOUNTER — Ambulatory Visit: Payer: Federal, State, Local not specified - PPO | Admitting: Internal Medicine

## 2023-11-16 ENCOUNTER — Telehealth: Payer: Self-pay

## 2023-11-16 VITALS — BP 162/90 | Ht 67.0 in | Wt 214.2 lb

## 2023-11-16 DIAGNOSIS — E6609 Other obesity due to excess calories: Secondary | ICD-10-CM

## 2023-11-16 DIAGNOSIS — R7303 Prediabetes: Secondary | ICD-10-CM

## 2023-11-16 DIAGNOSIS — E66811 Obesity, class 1: Secondary | ICD-10-CM | POA: Diagnosis not present

## 2023-11-16 DIAGNOSIS — E7800 Pure hypercholesterolemia, unspecified: Secondary | ICD-10-CM | POA: Diagnosis not present

## 2023-11-16 DIAGNOSIS — Z6833 Body mass index (BMI) 33.0-33.9, adult: Secondary | ICD-10-CM

## 2023-11-16 DIAGNOSIS — I10 Essential (primary) hypertension: Secondary | ICD-10-CM

## 2023-11-16 DIAGNOSIS — D508 Other iron deficiency anemias: Secondary | ICD-10-CM

## 2023-11-16 MED ORDER — OLMESARTAN MEDOXOMIL-HCTZ 20-12.5 MG PO TABS: 1.0000 | ORAL_TABLET | Freq: Every day | ORAL | 1 refills | Status: DC

## 2023-11-16 NOTE — Telephone Encounter (Signed)
 appointment

## 2023-11-16 NOTE — Patient Instructions (Signed)

## 2023-11-16 NOTE — Progress Notes (Unsigned)
 Subjective:    Patient ID: Felicia Jackson, female    DOB: 07/15/1971, 53 y.o.   MRN: [id number]  HPI  Patient presents to clinic today for follow-up of chronic conditions.  HTN: Her BP today is 162/90.  She is taking hydrochlorothiazide as prescribed.  There is no ECG on file.  Prediabetes: Her last A1c was 6 %, 05/2023.  She is not taking any oral diabetic medication at this time.  She does not check her sugars.  HLD: Her last LDL was 99, triglycerides 93, 05/2023.  She denies myalgias on simvastatin.  She does not consume low-fat diet.  Iron deficiency anemia: Her last H/H was 12.4/38.8, 05/2023.  She is taking oral iron as prescribed.  She does not follow with hematology.  Neutropenia: Her last WBC count was 3.3, 05/2023.  She does not follow with hematology.   Review of Systems     Past Medical History:  Diagnosis Date  . Hypertension     Current Outpatient Medications  Medication Sig Dispense Refill  . ferrous sulfate 325 (65 FE) MG tablet Take 325 mg by mouth daily with breakfast.    . hydrochlorothiazide (HYDRODIURIL) 25 MG tablet Take 1 tablet (25 mg total) by mouth daily. 90 tablet 1  . simvastatin (ZOCOR) 10 MG tablet Take 1 tablet (10 mg total) by mouth at bedtime. 90 tablet 1  . vitamin B-12 (CYANOCOBALAMIN) 1000 MCG tablet Take 1,000 mcg by mouth daily.     No current facility-administered medications for this visit.    No Known Allergies  Family History  Problem Relation Age of Onset  . Heart attack Mother   . Hypertension Father     Social History   Socioeconomic History  . Marital status: Widowed    Spouse name: Not on file  . Number of children: Not on file  . Years of education: Not on file  . Highest education level: Associate degree: academic program  Occupational History  . Not on file  Tobacco Use  . Smoking status: Never  . Smokeless tobacco: Never  Vaping Use  . Vaping status: Never Used  Substance and Sexual Activity  . Alcohol use:  Not Currently  . Drug use: Never  . Sexual activity: Not on file  Other Topics Concern  . Not on file  Social History Narrative  . Not on file   Social Drivers of Health   Financial Resource Strain: Low Risk  (11/15/2023)   Overall Financial Resource Strain (CARDIA)   . Difficulty of Paying Living Expenses: Not hard at all  Food Insecurity: No Food Insecurity (11/15/2023)   Hunger Vital Sign   . Worried About Programme researcher, broadcasting/film/video in the Last Year: Never true   . Ran Out of Food in the Last Year: Never true  Transportation Needs: No Transportation Needs (11/15/2023)   PRAPARE - Transportation   . Lack of Transportation (Medical): No   . Lack of Transportation (Non-Medical): No  Physical Activity: Unknown (11/15/2023)   Exercise Vital Sign   . Days of Exercise per Week: 0 days   . Minutes of Exercise per Session: Not on file  Stress: No Stress Concern Present (11/15/2023)   [employer] of Occupational Health - Occupational Stress Questionnaire   . Feeling of Stress : Not at all  Social Connections: Socially Isolated (11/15/2023)   Social Connection and Isolation Panel [NHANES]   . Frequency of Communication with Friends and Family: Once a week   . Frequency of Social Gatherings  with Friends and Family: Once a week   . Attends Religious Services: Never   . Active Member of Clubs or Organizations: No   . Attends Banker Meetings: Not on file   . Marital Status: Widowed  Intimate Partner Violence: Not on file     Constitutional: Denies fever, malaise, fatigue, headache or abrupt weight changes.  HEENT: Denies eye pain, eye redness, ear pain, ringing in the ears, wax buildup, runny nose, nasal congestion, bloody nose, or sore throat. Respiratory: Denies difficulty breathing, shortness of breath, cough or sputum production.   Cardiovascular: Denies chest pain, chest tightness, palpitations or swelling in the hands or feet.  Gastrointestinal: Denies abdominal pain,  bloating, constipation, diarrhea or blood in the stool.  GU: Pt reports urinary frequency. Denies urgency, pain with urination, burning sensation, blood in urine, odor or discharge. Musculoskeletal: Denies decrease in range of motion, difficulty with gait, muscle pain or joint pain and swelling.  Skin: Denies redness, rashes, lesions or ulcercations.  Neurological: Denies dizziness, difficulty with memory, difficulty with speech or problems with balance and coordination.  Psych: Denies anxiety, depression, SI/HI.  No other specific complaints in a complete review of systems (except as listed in HPI above).  Objective:   Physical Exam  BP (!) 162/90 (BP Location: Left Arm, Patient Position: Sitting, Cuff Size: Normal)   Ht 5\' 7"  (1.702 m)   Wt 214 lb 3.2 oz (97.2 kg)   LMP 12/30/2011   BMI 33.55 kg/m    Wt Readings from Last 3 Encounters:  07/14/23 214 lb 6.4 oz (97.3 kg)  05/18/23 219 lb (99.3 kg)  11/17/22 219 lb (99.3 kg)    General: Appears her stated age, obese, in NAD. Skin: Warm, dry and intact.  HEENT: Head: normal shape and size; Eyes: sclera white, no icterus, conjunctiva pink, PERRLA and EOMs intact;  Cardiovascular: Normal rate and rhythm. S1,S2 noted.  No murmur, rubs or gallops noted. No JVD or BLE edema. No carotid bruits noted. Pulmonary/Chest: Normal effort and positive vesicular breath sounds. No respiratory distress. No wheezes, rales or ronchi noted.  Musculoskeletal:  No difficulty with gait.  Neurological: Alert and oriented. Coordination normal.    BMET    Component Value Date/Time   NA 139 05/18/2023 1601   K 4.1 05/18/2023 1601   CL 105 05/18/2023 1601   CO2 29 05/18/2023 1601   GLUCOSE 93 05/18/2023 1601   BUN 14 05/18/2023 1601   CREATININE 0.65 05/18/2023 1601   CALCIUM 8.6 05/18/2023 1601    Lipid Panel     Component Value Date/Time   CHOL 161 05/18/2023 1601   TRIG 93 05/18/2023 1601   HDL 43 (L) 05/18/2023 1601   CHOLHDL 3.7  05/18/2023 1601   LDLCALC 99 05/18/2023 1601    CBC    Component Value Date/Time   WBC 3.3 (L) 05/18/2023 1601   RBC 4.69 05/18/2023 1601   HGB 12.4 05/18/2023 1601   HCT 38.8 05/18/2023 1601   PLT 279 05/18/2023 1601   MCV 82.7 05/18/2023 1601   MCH 26.4 (L) 05/18/2023 1601   MCHC 32.0 05/18/2023 1601   RDW 12.4 05/18/2023 1601   LYMPHSABS 0.9 12/29/2019 1133   MONOABS 0.2 12/29/2019 1133   EOSABS 0.0 12/29/2019 1133   BASOSABS 0.0 12/29/2019 1133    Hgb A1C Lab Results  Component Value Date   HGBA1C 6.0 (H) 05/18/2023            Assessment & Plan:  RTC in 2 weeks for followup HTN,  6 months, for your annual exam Nicki Reaper, NP

## 2023-11-17 ENCOUNTER — Encounter: Payer: Self-pay | Admitting: Internal Medicine

## 2023-11-17 LAB — LIPID PANEL
Cholesterol: 182 mg/dL (ref <200)
HDL: 54 mg/dL (ref >=50)
LDL Cholesterol (Calc): 111 mg/dL — ABNORMAL HIGH
Non-HDL Cholesterol (Calc): 128 mg/dL (ref <130)
Total CHOL/HDL Ratio: 3.4 (calc) (ref <5.0)
Triglycerides: 76 mg/dL (ref <150)

## 2023-11-17 LAB — CBC
HCT: 39.4 % (ref 35.0–45.0)
Hemoglobin: 12.2 g/dL (ref 11.7–15.5)
MCH: 26.1 pg — ABNORMAL LOW (ref 27.0–33.0)
MCHC: 31 g/dL — ABNORMAL LOW (ref 32.0–36.0)
MCV: 84.2 fL (ref 80.0–100.0)
MPV: 9.4 fL (ref 7.5–12.5)
Platelets: 300 10*3/uL (ref 140–400)
RBC: 4.68 10*6/uL (ref 3.80–5.10)
RDW: 12.4 % (ref 11.0–15.0)
WBC: 5.3 10*3/uL (ref 3.8–10.8)

## 2023-11-17 LAB — COMPLETE METABOLIC PANEL WITH GFR
AG Ratio: 0.9 (calc) — ABNORMAL LOW (ref 1.0–2.5)
ALT: 9 U/L (ref 6–29)
AST: 13 U/L (ref 10–35)
Albumin: 3.5 g/dL — ABNORMAL LOW (ref 3.6–5.1)
Alkaline phosphatase (APISO): 105 U/L (ref 37–153)
BUN: 13 mg/dL (ref 7–25)
CO2: 32 mmol/L (ref 20–32)
Calcium: 8.8 mg/dL (ref 8.6–10.4)
Chloride: 106 mmol/L (ref 98–110)
Creat: 0.69 mg/dL (ref 0.50–1.03)
Globulin: 3.7 g/dL (ref 1.9–3.7)
Glucose, Bld: 88 mg/dL (ref 65–139)
Potassium: 3.9 mmol/L (ref 3.5–5.3)
Sodium: 141 mmol/L (ref 135–146)
Total Bilirubin: 0.4 mg/dL (ref 0.2–1.2)
Total Protein: 7.2 g/dL (ref 6.1–8.1)
eGFR: 103 mL/min/{1.73_m2} (ref >=60)

## 2023-11-17 LAB — HEMOGLOBIN A1C
Hgb A1c MFr Bld: 5.9 %{Hb} — ABNORMAL HIGH (ref <5.7)
Mean Plasma Glucose: 123 mg/dL
eAG (mmol/L): 6.8 mmol/L

## 2023-11-17 NOTE — Assessment & Plan Note (Signed)
CBC today Continue oral iron 

## 2023-11-17 NOTE — Assessment & Plan Note (Signed)
 C-Met and lipid profile today Encouraged her to consume low-fat diet Continue simvastatin

## 2023-11-17 NOTE — Assessment & Plan Note (Signed)
 Encourage diet and exercise for weight loss

## 2023-11-17 NOTE — Assessment & Plan Note (Signed)
 Uncontrolled on HCTZ, will discontinue Rx for olmesartan-HCT Reinforced DASH diet and exercise for weight loss C-Met today

## 2023-11-17 NOTE — Assessment & Plan Note (Signed)
 A1c today Encourage low-carb diet and exercise for weight loss

## 2023-11-18 ENCOUNTER — Ambulatory Visit
Admission: RE | Admit: 2023-11-18 | Discharge: 2023-11-18 | Disposition: A | Discharge status: Home / Self Care | Source: Ambulatory Visit | Attending: Internal Medicine | Admitting: Internal Medicine

## 2023-11-18 ENCOUNTER — Other Ambulatory Visit: Payer: Self-pay

## 2023-11-18 DIAGNOSIS — R928 Other abnormal and inconclusive findings on diagnostic imaging of breast: Secondary | ICD-10-CM

## 2023-11-18 DIAGNOSIS — R92333 Mammographic heterogeneous density, bilateral breasts: Secondary | ICD-10-CM | POA: Diagnosis not present

## 2023-11-18 MED ORDER — SIMVASTATIN 20 MG PO TABS: 20.0000 mg | ORAL_TABLET | Freq: Every day | ORAL | 0 refills | Status: DC

## 2023-12-09 ENCOUNTER — Ambulatory Visit: Admitting: Internal Medicine

## 2023-12-09 ENCOUNTER — Encounter: Payer: Self-pay | Admitting: Internal Medicine

## 2023-12-09 VITALS — BP 138/80 | Ht 67.0 in | Wt 209.2 lb

## 2023-12-09 DIAGNOSIS — E66811 Obesity, class 1: Secondary | ICD-10-CM | POA: Diagnosis not present

## 2023-12-09 DIAGNOSIS — E6609 Other obesity due to excess calories: Secondary | ICD-10-CM

## 2023-12-09 DIAGNOSIS — Z6832 Body mass index (BMI) 32.0-32.9, adult: Secondary | ICD-10-CM | POA: Diagnosis not present

## 2023-12-09 DIAGNOSIS — I10 Essential (primary) hypertension: Secondary | ICD-10-CM

## 2023-12-09 NOTE — Assessment & Plan Note (Signed)
 Improved but not quite at goal Continue olmesartan HCT at current dose If remains elevated at next visit, will increase to 40-25 Reinforced DASH diet and exercise for weight loss

## 2023-12-09 NOTE — Progress Notes (Signed)
 Subjective:    Patient ID: Felicia Jackson, female    DOB: Dec 01, 1970, 53 y.o.   MRN: [id number]  HPI  Patient presents to clinic today for 2-week follow-up of HTN.  At her last visit, her HCTZ was changed to olmesartan-HCT.  She has been taken the medication as prescribed.  Her BP today is 142/82.  There is no ECG on file.   Review of Systems     Past Medical History:  Diagnosis Date   Hypertension     Current Outpatient Medications  Medication Sig Dispense Refill   simvastatin (ZOCOR) 20 MG tablet Take 1 tablet (20 mg total) by mouth daily. 90 tablet 0   ferrous sulfate 325 (65 FE) MG tablet Take 325 mg by mouth daily with breakfast.     olmesartan-hydrochlorothiazide (BENICAR HCT) 20-12.5 MG tablet Take 1 tablet by mouth daily. 90 tablet 1   vitamin B-12 (CYANOCOBALAMIN) 1000 MCG tablet Take 1,000 mcg by mouth daily. (Patient not taking: Reported on 11/16/2023)     No current facility-administered medications for this visit.    No Known Allergies  Family History  Problem Relation Age of Onset   Heart attack Mother    Hypertension Father     Social History   Socioeconomic History   Marital status: Widowed    Spouse name: Not on file   Number of children: Not on file   Years of education: Not on file   Highest education level: Associate degree: academic program  Occupational History   Not on file  Tobacco Use   Smoking status: Never   Smokeless tobacco: Never  Vaping Use   Vaping status: Never Used  Substance and Sexual Activity   Alcohol use: Not Currently   Drug use: Never   Sexual activity: Not on file  Other Topics Concern   Not on file  Social History Narrative   Not on file   Social Drivers of Health   Financial Resource Strain: Low Risk  (11/15/2023)   Overall Financial Resource Strain (CARDIA)    Difficulty of Paying Living Expenses: Not hard at all  Food Insecurity: No Food Insecurity (11/15/2023)   Hunger Vital Sign    Worried About Running  Out of Food in the Last Year: Never true    Ran Out of Food in the Last Year: Never true  Transportation Needs: No Transportation Needs (11/15/2023)   PRAPARE - Administrator, Civil Service (Medical): No    Lack of Transportation (Non-Medical): No  Physical Activity: Unknown (11/15/2023)   Exercise Vital Sign    Days of Exercise per Week: 0 days    Minutes of Exercise per Session: Not on file  Stress: No Stress Concern Present (11/15/2023)   [employer] of Occupational Health - Occupational Stress Questionnaire    Feeling of Stress : Not at all  Social Connections: Socially Isolated (11/15/2023)   Social Connection and Isolation Panel [NHANES]    Frequency of Communication with Friends and Family: Once a week    Frequency of Social Gatherings with Friends and Family: Once a week    Attends Religious Services: Never    Database administrator or Organizations: No    Attends Engineer, structural: Not on file    Marital Status: Widowed  Intimate Partner Violence: Not on file     Constitutional: Denies fever, malaise, fatigue, headache or abrupt weight changes.  Respiratory: Denies difficulty breathing, shortness of breath, cough or sputum production.   Cardiovascular:  Denies chest pain, chest tightness, palpitations or swelling in the hands or feet.  Musculoskeletal: Denies decrease in range of motion, difficulty with gait, muscle pain or joint pain and swelling.  Skin: Denies redness, rashes, lesions or ulcercations.  Neurological: Denies dizziness, difficulty with memory, difficulty with speech or problems with balance and coordination.   No other specific complaints in a complete review of systems (except as listed in HPI above).  Objective:   Physical Exam  BP 138/80   Ht 5\' 7"  (1.702 m)   Wt 209 lb 3.2 oz (94.9 kg)   LMP 12/30/2011   BMI 32.77 kg/m     Wt Readings from Last 3 Encounters:  11/16/23 214 lb 3.2 oz (97.2 kg)  07/14/23 214 lb 6.4 oz  (97.3 kg)  05/18/23 219 lb (99.3 kg)    General: Appears her stated age, obese, in NAD. Cardiovascular: Normal rate and rhythm. S1,S2 noted.  No murmur, rubs or gallops noted. No JVD or BLE edema.  Pulmonary/Chest: Normal effort and positive vesicular breath sounds. No respiratory distress. No wheezes, rales or ronchi noted.  Musculoskeletal:  No difficulty with gait.  Neurological: Alert and oriented.    BMET    Component Value Date/Time   NA 141 11/16/2023 1543   K 3.9 11/16/2023 1543   CL 106 11/16/2023 1543   CO2 32 11/16/2023 1543   GLUCOSE 88 11/16/2023 1543   BUN 13 11/16/2023 1543   CREATININE 0.69 11/16/2023 1543   CALCIUM 8.8 11/16/2023 1543    Lipid Panel     Component Value Date/Time   CHOL 182 11/16/2023 1543   TRIG 76 11/16/2023 1543   HDL 54 11/16/2023 1543   CHOLHDL 3.4 11/16/2023 1543   LDLCALC 111 (H) 11/16/2023 1543    CBC    Component Value Date/Time   WBC 5.3 11/16/2023 1543   RBC 4.68 11/16/2023 1543   HGB 12.2 11/16/2023 1543   HCT 39.4 11/16/2023 1543   PLT 300 11/16/2023 1543   MCV 84.2 11/16/2023 1543   MCH 26.1 (L) 11/16/2023 1543   MCHC 31.0 (L) 11/16/2023 1543   RDW 12.4 11/16/2023 1543   LYMPHSABS 0.9 12/29/2019 1133   MONOABS 0.2 12/29/2019 1133   EOSABS 0.0 12/29/2019 1133   BASOSABS 0.0 12/29/2019 1133    Hgb A1C Lab Results  Component Value Date   HGBA1C 5.9 (H) 11/16/2023            Assessment & Plan:     RTC in 5 months, for your annual exam Nicki Reaper, NP

## 2023-12-09 NOTE — Assessment & Plan Note (Signed)
 Encourage diet and exercise for weight loss

## 2023-12-09 NOTE — Patient Instructions (Signed)
 Hypertension, Adult Hypertension is another name for high blood pressure. High blood pressure forces your heart to work harder to pump blood. This can cause problems over time. There are two numbers in a blood pressure reading. There is a top number (systolic) over a bottom number (diastolic). It is best to have a blood pressure that is below 120/80. What are the causes? The cause of this condition is not known. Some other conditions can lead to high blood pressure. What increases the risk? Some lifestyle factors can make you more likely to develop high blood pressure: Smoking. Not getting enough exercise or physical activity. Being overweight. Having too much fat, sugar, calories, or salt (sodium) in your diet. Drinking too much alcohol. Other risk factors include: Having any of these conditions: Heart disease. Diabetes. High cholesterol. Kidney disease. Obstructive sleep apnea. Having a family history of high blood pressure and high cholesterol. Age. The risk increases with age. Stress. What are the signs or symptoms? High blood pressure may not cause symptoms. Very high blood pressure (hypertensive crisis) may cause: Headache. Fast or uneven heartbeats (palpitations). Shortness of breath. Nosebleed. Vomiting or feeling like you may vomit (nauseous). Changes in how you see. Very bad chest pain. Feeling dizzy. Seizures. How is this treated? This condition is treated by making healthy lifestyle changes, such as: Eating healthy foods. Exercising more. Drinking less alcohol. Your doctor may prescribe medicine if lifestyle changes do not help enough and if: Your top number is above 130. Your bottom number is above 80. Your personal target blood pressure may vary. Follow these instructions at home: Eating and drinking  If told, follow the DASH eating plan. To follow this plan: Fill one half of your plate at each meal with fruits and vegetables. Fill one fourth of your plate  at each meal with whole grains. Whole grains include whole-wheat pasta, brown rice, and whole-grain bread. Eat or drink low-fat dairy products, such as skim milk or low-fat yogurt. Fill one fourth of your plate at each meal with low-fat (lean) proteins. Low-fat proteins include fish, chicken without skin, eggs, beans, and tofu. Avoid fatty meat, cured and processed meat, or chicken with skin. Avoid pre-made or processed food. Limit the amount of salt in your diet to less than 1,500 mg each day. Do not drink alcohol if: Your doctor tells you not to drink. You are pregnant, may be pregnant, or are planning to become pregnant. If you drink alcohol: Limit how much you have to: 0-1 drink a day for women. 0-2 drinks a day for men. Know how much alcohol is in your drink. In the [place] one drink equals one 12 oz bottle of beer (355 mL), one 5 oz glass of wine (148 mL), or one 1 oz glass of hard liquor (44 mL). Lifestyle  Work with your doctor to stay at a healthy weight or to lose weight. Ask your doctor what the best weight is for you. Get at least 30 minutes of exercise that causes your heart to beat faster (aerobic exercise) most days of the week. This may include walking, swimming, or biking. Get at least 30 minutes of exercise that strengthens your muscles (resistance exercise) at least 3 days a week. This may include lifting weights or doing Pilates. Do not smoke or use any products that contain nicotine or tobacco. If you need help quitting, ask your doctor. Check your blood pressure at home as told by your doctor. Keep all follow-up visits. Medicines Take over-the-counter and prescription medicines  only as told by your doctor. Follow directions carefully. Do not skip doses of blood pressure medicine. The medicine does not work as well if you skip doses. Skipping doses also puts you at risk for problems. Ask your doctor about side effects or reactions to medicines that you should watch  for. Contact a doctor if: You think you are having a reaction to the medicine you are taking. You have headaches that keep coming back. You feel dizzy. You have swelling in your ankles. You have trouble with your vision. Get help right away if: You get a very bad headache. You start to feel mixed up (confused). You feel weak or numb. You feel faint. You have very bad pain in your: Chest. Belly (abdomen). You vomit more than once. You have trouble breathing. These symptoms may be an emergency. Get help right away. Call 911. Do not wait to see if the symptoms will go away. Do not drive yourself to the hospital. Summary Hypertension is another name for high blood pressure. High blood pressure forces your heart to work harder to pump blood. For most people, a normal blood pressure is less than 120/80. Making healthy choices can help lower blood pressure. If your blood pressure does not get lower with healthy choices, you may need to take medicine. This information is not intended to replace advice given to you by your health care provider. Make sure you discuss any questions you have with your health care provider. Document Revised: 06/12/2021 Document Reviewed: 06/12/2021 Elsevier Patient Education  2024 [employer].

## 2023-12-10 ENCOUNTER — Ambulatory Visit: Admitting: Internal Medicine

## 2024-02-10 ENCOUNTER — Other Ambulatory Visit: Payer: Self-pay | Admitting: Internal Medicine

## 2024-02-11 NOTE — Telephone Encounter (Signed)
 Requested Prescriptions  Pending Prescriptions Disp Refills   simvastatin  (ZOCOR ) 20 MG tablet [Pharmacy Med Name: Simvastatin  20 MG Oral Tablet] 90 tablet 0    Sig: Take 1 tablet by mouth once daily     Cardiovascular:  Antilipid - Statins Failed - 02/11/2024 11:22 AM      Failed - Lipid Panel in normal range within the last 12 months    Cholesterol  Date Value Ref Range Status  11/16/2023 182 <200 mg/dL Final   LDL Cholesterol (Calc)  Date Value Ref Range Status  11/16/2023 111 (H) mg/dL (calc) Final    Comment:    Reference range: <100 . Desirable range <100 mg/dL for primary prevention;   <70 mg/dL for patients with CHD or diabetic patients  with > or = 2 CHD risk factors. Aaron Aas LDL-C is now calculated using the Martin-Hopkins  calculation, which is a validated novel method providing  better accuracy than the Friedewald equation in the  estimation of LDL-C.  Melinda Sprawls et al. Erroll Heard. 1610;960(45): 2061-2068  (http://education.[employer].com/faq/FAQ164)    HDL  Date Value Ref Range Status  11/16/2023 54 > OR = 50 mg/dL Final   Triglycerides  Date Value Ref Range Status  11/16/2023 76 <150 mg/dL Final         Passed - Patient is not pregnant      Passed - Valid encounter within last 12 months    Recent Outpatient Visits           2 months ago Primary hypertension   [place] [facility] [place], Rankin Buzzard, NP   2 months ago Prediabetes   [facility] [place], Rankin Buzzard, [place]

## 2024-05-18 ENCOUNTER — Encounter: Payer: Self-pay | Admitting: Internal Medicine

## 2024-05-18 ENCOUNTER — Ambulatory Visit (INDEPENDENT_AMBULATORY_CARE_PROVIDER_SITE_OTHER): Admitting: Internal Medicine

## 2024-05-18 VITALS — BP 128/84 | Ht 67.0 in | Wt 193.0 lb

## 2024-05-18 DIAGNOSIS — Z0001 Encounter for general adult medical examination with abnormal findings: Secondary | ICD-10-CM | POA: Diagnosis not present

## 2024-05-18 DIAGNOSIS — R7303 Prediabetes: Secondary | ICD-10-CM | POA: Diagnosis not present

## 2024-05-18 DIAGNOSIS — E66811 Obesity, class 1: Secondary | ICD-10-CM

## 2024-05-18 DIAGNOSIS — Z6830 Body mass index (BMI) 30.0-30.9, adult: Secondary | ICD-10-CM

## 2024-05-18 DIAGNOSIS — E7800 Pure hypercholesterolemia, unspecified: Secondary | ICD-10-CM

## 2024-05-18 DIAGNOSIS — E6609 Other obesity due to excess calories: Secondary | ICD-10-CM

## 2024-05-18 MED ORDER — OLMESARTAN MEDOXOMIL-HCTZ 20-12.5 MG PO TABS: 1.0000 | ORAL_TABLET | Freq: Every day | ORAL | 1 refills | Status: AC

## 2024-05-18 NOTE — Assessment & Plan Note (Signed)
 Encourage diet and exercise for weight loss

## 2024-05-18 NOTE — Patient Instructions (Signed)
 Health Maintenance for Postmenopausal Women Menopause is a normal process in which your ability to get pregnant comes to an end. This process happens slowly over many months or years, usually between the ages of 76 and 38. Menopause is complete when you have missed your menstrual period for 12 months. It is important to talk with your health care provider about some of the most common conditions that affect women after menopause (postmenopausal women). These include heart disease, cancer, and bone loss (osteoporosis). Adopting a healthy lifestyle and getting preventive care can help to promote your health and wellness. The actions you take can also lower your chances of developing some of these common conditions. What are the signs and symptoms of menopause? During menopause, you may have the following symptoms: Hot flashes. These can be moderate or severe. Night sweats. Decrease in sex drive. Mood swings. Headaches. Tiredness (fatigue). Irritability. Memory problems. Problems falling asleep or staying asleep. Talk with your health care provider about treatment options for your symptoms. Do I need hormone replacement therapy? Hormone replacement therapy is effective in treating symptoms that are caused by menopause, such as hot flashes and night sweats. Hormone replacement carries certain risks, especially as you become older. If you are thinking about using estrogen or estrogen with progestin, discuss the benefits and risks with your health care provider. How can I reduce my risk for heart disease and stroke? The risk of heart disease, heart attack, and stroke increases as you age. One of the causes may be a change in the body's hormones during menopause. This can affect how your body uses dietary fats, triglycerides, and cholesterol. Heart attack and stroke are medical emergencies. There are many things that you can do to help prevent heart disease and stroke. Watch your blood pressure High  blood pressure causes heart disease and increases the risk of stroke. This is more likely to develop in people who have high blood pressure readings or are overweight. Have your blood pressure checked: Every 3-5 years if you are 32-23 years of age. Every year if you are 31 years old or older. Eat a healthy diet  Eat a diet that includes plenty of vegetables, fruits, low-fat dairy products, and lean protein. Do not eat a lot of foods that are high in solid fats, added sugars, or sodium. Get regular exercise Get regular exercise. This is one of the most important things you can do for your health. Most adults should: Try to exercise for at least 150 minutes each week. The exercise should increase your heart rate and make you sweat (moderate-intensity exercise). Try to do strengthening exercises at least twice each week. Do these in addition to the moderate-intensity exercise. Spend less time sitting. Even light physical activity can be beneficial. Other tips Work with your health care provider to achieve or maintain a healthy weight. Do not use any products that contain nicotine or tobacco. These products include cigarettes, chewing tobacco, and vaping devices, such as e-cigarettes. If you need help quitting, ask your health care provider. Know your numbers. Ask your health care provider to check your cholesterol and your blood sugar (glucose). Continue to have your blood tested as directed by your health care provider. Do I need screening for cancer? Depending on your health history and family history, you may need to have cancer screenings at different stages of your life. This may include screening for: Breast cancer. Cervical cancer. Lung cancer. Colorectal cancer. What is my risk for osteoporosis? After menopause, you may be  at increased risk for osteoporosis. Osteoporosis is a condition in which bone destruction happens more quickly than new bone creation. To help prevent osteoporosis or  the bone fractures that can happen because of osteoporosis, you may take the following actions: If you are 24-54 years old, get at least 1,000 mg of calcium and at least 600 international units (IU) of vitamin D  per day. If you are older than age 75 but younger than age 30, get at least 1,200 mg of calcium and at least 600 international units (IU) of vitamin D  per day. If you are older than age 8, get at least 1,200 mg of calcium and at least 800 international units (IU) of vitamin D  per day. Smoking and drinking excessive alcohol increase the risk of osteoporosis. Eat foods that are rich in calcium and vitamin D , and do weight-bearing exercises several times each week as directed by your health care provider. How does menopause affect my mental health? Depression may occur at any age, but it is more common as you become older. Common symptoms of depression include: Feeling depressed. Changes in sleep patterns. Changes in appetite or eating patterns. Feeling an overall lack of motivation or enjoyment of activities that you previously enjoyed. Frequent crying spells. Talk with your health care provider if you think that you are experiencing any of these symptoms. General instructions See your health care provider for regular wellness exams and vaccines. This may include: Scheduling regular health, dental, and eye exams. Getting and maintaining your vaccines. These include: Influenza vaccine. Get this vaccine each year before the flu season begins. Pneumonia vaccine. Shingles vaccine. Tetanus, diphtheria, and pertussis (Tdap) booster vaccine. Your health care provider may also recommend other immunizations. Tell your health care provider if you have ever been abused or do not feel safe at home. Summary Menopause is a normal process in which your ability to get pregnant comes to an end. This condition causes hot flashes, night sweats, decreased interest in sex, mood swings, headaches, or lack  of sleep. Treatment for this condition may include hormone replacement therapy. Take actions to keep yourself healthy, including exercising regularly, eating a healthy diet, watching your weight, and checking your blood pressure and blood sugar levels. Get screened for cancer and depression. Make sure that you are up to date with all your vaccines. This information is not intended to replace advice given to you by your health care provider. Make sure you discuss any questions you have with your health care provider. Document Revised: 01/13/2021 Document Reviewed: 01/13/2021 Elsevier Patient Education  2024 [employer].

## 2024-05-18 NOTE — Progress Notes (Signed)
 Subjective:    Patient ID: Felicia Jackson, female    DOB: 22-Feb-1971, 53 y.o.   MRN: [id number]  HPI  Patient presents to clinic today for annual exam.  Flu: never Tetanus: 03/2021 COVID: x 3 Shingrix : 03/2021, 05/2022 Pap smear: 05/2022, abnormal, repeat in 3 year Mammogram: 11/2023 Colon screening: 07/2023 Vision screening: annually Dentist: as needed  Diet: She does eat meat. She does eat fruits and veggies. She tries to avoid fried foods. She drinks mostly water and orange juice. Exercise: Walking  Review of Systems     Past Medical History:  Diagnosis Date   Hypertension     Current Outpatient Medications  Medication Sig Dispense Refill   ferrous sulfate 325 (65 FE) MG tablet Take 325 mg by mouth daily with breakfast.     olmesartan -hydrochlorothiazide  (BENICAR  HCT) 20-12.5 MG tablet Take 1 tablet by mouth daily. 90 tablet 1   simvastatin  (ZOCOR ) 20 MG tablet Take 1 tablet by mouth once daily 90 tablet 0   vitamin B-12 (CYANOCOBALAMIN ) 1000 MCG tablet Take 1,000 mcg by mouth daily.     No current facility-administered medications for this visit.    No Known Allergies  Family History  Problem Relation Age of Onset   Heart attack Mother    Hypertension Father     Social History   Socioeconomic History   Marital status: Widowed    Spouse name: Not on file   Number of children: Not on file   Years of education: Not on file   Highest education level: Some college, no degree  Occupational History   Not on file  Tobacco Use   Smoking status: Never   Smokeless tobacco: Never  Vaping Use   Vaping status: Never Used  Substance and Sexual Activity   Alcohol use: Not Currently   Drug use: Never   Sexual activity: Not on file  Other Topics Concern   Not on file  Social History Narrative   Not on file   Social Drivers of Health   Financial Resource Strain: Low Risk  (05/16/2024)   Overall Financial Resource Strain (CARDIA)    Difficulty of Paying Living  Expenses: Not hard at all  Food Insecurity: No Food Insecurity (05/16/2024)   Hunger Vital Sign    Worried About Running Out of Food in the Last Year: Never true    Ran Out of Food in the Last Year: Never true  Transportation Needs: No Transportation Needs (05/16/2024)   PRAPARE - Administrator, Civil Service (Medical): No    Lack of Transportation (Non-Medical): No  Physical Activity: Insufficiently Active (05/16/2024)   Exercise Vital Sign    Days of Exercise per Week: 4 days    Minutes of Exercise per Session: 20 min  Stress: No Stress Concern Present (05/16/2024)   [employer] of Occupational Health - Occupational Stress Questionnaire    Feeling of Stress: Not at all  Social Connections: Socially Isolated (05/16/2024)   Social Connection and Isolation Panel    Frequency of Communication with Friends and Family: Three times a week    Frequency of Social Gatherings with Friends and Family: Once a week    Attends Religious Services: Never    Database administrator or Organizations: No    Attends Engineer, structural: Not on file    Marital Status: Widowed  Intimate Partner Violence: Not on file     Constitutional: Denies fever, malaise, fatigue, headache or abrupt weight changes.  HEENT: Denies  eye pain, eye redness, ear pain, ringing in the ears, wax buildup, runny nose, nasal congestion, bloody nose, or sore throat. Respiratory: Denies difficulty breathing, shortness of breath, cough or sputum production.   Cardiovascular: Denies chest pain, chest tightness, palpitations or swelling in the hands or feet.  Gastrointestinal: Denies abdominal pain, bloating, constipation, diarrhea or blood in the stool.  GU: Patient reports urinary urgency.  Denies frequency, pain with urination, burning sensation, blood in urine, odor or discharge. Musculoskeletal: Denies decrease in range of motion, difficulty with gait, muscle pain or joint pain and swelling.  Skin: Denies  redness, rashes, lesions or ulcercations.  Neurological: Denies dizziness, difficulty with memory, difficulty with speech or problems with balance and coordination.  Psych: Denies anxiety, depression, SI/HI.  No other specific complaints in a complete review of systems (except as listed in HPI above).  Objective:   Physical Exam BP 128/84 (BP Location: Left Arm, Patient Position: Sitting, Cuff Size: Normal)   Ht 5' 7 (1.702 m)   Wt 193 lb (87.5 kg)   LMP 12/30/2011   BMI 30.23 kg/m    Wt Readings from Last 3 Encounters:  12/09/23 209 lb 3.2 oz (94.9 kg)  11/16/23 214 lb 3.2 oz (97.2 kg)  07/14/23 214 lb 6.4 oz (97.3 kg)    General: Appears her stated age, obese, in NAD. Skin: Warm, dry and intact.  HEENT: Head: normal shape and size; Eyes: sclera white, no icterus, conjunctiva pink, PERRLA and EOMs intact; Mouth: Dental decay noted. Neck:  Neck supple, trachea midline. No masses, lumps or thyromegaly present.  Cardiovascular: Normal rate and rhythm. S1,S2 noted.  No murmur, rubs or gallops noted. No JVD or BLE edema. No carotid bruits noted. Pulmonary/Chest: Normal effort and positive vesicular breath sounds. No respiratory distress. No wheezes, rales or ronchi noted.  Abdomen: Soft and nontender. Normal bowel sounds.  Musculoskeletal: Strength 5/5 BUE/BLE.  No difficulty with gait.  Neurological: Alert and oriented. Cranial nerves II-XII grossly intact. Coordination normal.  Psychiatric: Mood and affect normal. Behavior is normal. Judgment and thought content normal.    BMET    Component Value Date/Time   NA 141 11/16/2023 1543   K 3.9 11/16/2023 1543   CL 106 11/16/2023 1543   CO2 32 11/16/2023 1543   GLUCOSE 88 11/16/2023 1543   BUN 13 11/16/2023 1543   CREATININE 0.69 11/16/2023 1543   CALCIUM 8.8 11/16/2023 1543    Lipid Panel     Component Value Date/Time   CHOL 182 11/16/2023 1543   TRIG 76 11/16/2023 1543   HDL 54 11/16/2023 1543   CHOLHDL 3.4 11/16/2023  1543   LDLCALC 111 (H) 11/16/2023 1543    CBC    Component Value Date/Time   WBC 5.3 11/16/2023 1543   RBC 4.68 11/16/2023 1543   HGB 12.2 11/16/2023 1543   HCT 39.4 11/16/2023 1543   PLT 300 11/16/2023 1543   MCV 84.2 11/16/2023 1543   MCH 26.1 (L) 11/16/2023 1543   MCHC 31.0 (L) 11/16/2023 1543   RDW 12.4 11/16/2023 1543   LYMPHSABS 0.9 12/29/2019 1133   MONOABS 0.2 12/29/2019 1133   EOSABS 0.0 12/29/2019 1133   BASOSABS 0.0 12/29/2019 1133    Hgb A1C Lab Results  Component Value Date   HGBA1C 5.9 (H) 11/16/2023            Assessment & Plan:   Preventative health maintenance:  Flu shot declined Tetanus UTD Encouraged her to get her COVID-vaccine Shingrix  UTD Pap smear  UTD per her report, but she can not remember where she had this done at. Advised her to let me know so that I could request a copy. Mammogram UTD Colon screening UTD Encouraged her to consume a balanced diet and exercise regimen Advised her to see an eye doctor and dentist annually We will check CBC, c-Met, lipid, A1c today   RTC in 6 months, follow-up chronic conditions Angeline Laura, NP

## 2024-05-19 ENCOUNTER — Ambulatory Visit: Payer: Self-pay | Admitting: Internal Medicine

## 2024-05-19 LAB — LIPID PANEL
Cholesterol: 155 mg/dL (ref <200)
HDL: 53 mg/dL (ref >=50)
LDL Cholesterol (Calc): 88 mg/dL
Non-HDL Cholesterol (Calc): 102 mg/dL (ref <130)
Total CHOL/HDL Ratio: 2.9 (calc) (ref <5.0)
Triglycerides: 48 mg/dL (ref <150)

## 2024-05-19 LAB — CBC
HCT: 38.5 % (ref 35.0–45.0)
Hemoglobin: 12.1 g/dL (ref 11.7–15.5)
MCH: 26.9 pg — ABNORMAL LOW (ref 27.0–33.0)
MCHC: 31.4 g/dL — ABNORMAL LOW (ref 32.0–36.0)
MCV: 85.6 fL (ref 80.0–100.0)
MPV: 9.1 fL (ref 7.5–12.5)
Platelets: 305 Thousand/uL (ref 140–400)
RBC: 4.5 Million/uL (ref 3.80–5.10)
RDW: 12 % (ref 11.0–15.0)
WBC: 4.9 Thousand/uL (ref 3.8–10.8)

## 2024-05-19 LAB — COMPREHENSIVE METABOLIC PANEL WITH GFR
AG Ratio: 1 (calc) (ref 1.0–2.5)
ALT: 11 U/L (ref 6–29)
AST: 16 U/L (ref 10–35)
Albumin: 3.7 g/dL (ref 3.6–5.1)
Alkaline phosphatase (APISO): 70 U/L (ref 37–153)
BUN: 20 mg/dL (ref 7–25)
CO2: 32 mmol/L (ref 20–32)
Calcium: 9 mg/dL (ref 8.6–10.4)
Chloride: 103 mmol/L (ref 98–110)
Creat: 0.72 mg/dL (ref 0.50–1.03)
Globulin: 3.8 g/dL — ABNORMAL HIGH (ref 1.9–3.7)
Glucose, Bld: 79 mg/dL (ref 65–99)
Potassium: 3.8 mmol/L (ref 3.5–5.3)
Sodium: 139 mmol/L (ref 135–146)
Total Bilirubin: 0.3 mg/dL (ref 0.2–1.2)
Total Protein: 7.5 g/dL (ref 6.1–8.1)
eGFR: 99 mL/min/1.73m2 (ref >=60)

## 2024-05-19 LAB — HEMOGLOBIN A1C
Hgb A1c MFr Bld: 6 % — ABNORMAL HIGH (ref <5.7)
Mean Plasma Glucose: 126 mg/dL
eAG (mmol/L): 7 mmol/L

## 2024-05-22 ENCOUNTER — Telehealth: Payer: Self-pay

## 2024-05-22 MED ORDER — SIMVASTATIN 20 MG PO TABS: 20.0000 mg | ORAL_TABLET | Freq: Every day | ORAL | 1 refills | Status: AC

## 2024-05-22 NOTE — Telephone Encounter (Signed)
 Copied from CRM [id number]. Topic: Clinical - Medication Question >> May 22, 2024  4:20 PM Taleah C wrote: Reason for CRM: pt called and stated that her pcp was gonna wait for her results to come in before sending in her cholesterol medication. Her results has came back now she is requesting for med to be sent in. Please call and advise.

## 2024-05-22 NOTE — Addendum Note (Signed)
 Addended by: ANTONETTE ANGELINE ORN on: 05/22/2024 10:35 PM   Modules accepted: Orders

## 2024-11-16 ENCOUNTER — Ambulatory Visit: Admitting: Internal Medicine
